# Patient Record
Sex: Female | Born: 1995 | Race: Black or African American | Hispanic: No | Smoking: Never smoker
Health system: Southern US, Community
[De-identification: ages and names within clinical notes are randomized; demographics above are authoritative.]

## PROBLEM LIST (undated history)

## (undated) DIAGNOSIS — F32A Depression, unspecified: Secondary | ICD-10-CM

## (undated) DIAGNOSIS — J45909 Unspecified asthma, uncomplicated: Secondary | ICD-10-CM

## (undated) DIAGNOSIS — G47419 Narcolepsy without cataplexy: Secondary | ICD-10-CM

## (undated) HISTORY — DX: Narcolepsy without cataplexy: G47.419

## (undated) HISTORY — DX: Depression, unspecified: F32.A

## (undated) HISTORY — PX: WISDOM TOOTH EXTRACTION: SHX21

---
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
## 2017-03-21 ENCOUNTER — Ambulatory Visit (INDEPENDENT_AMBULATORY_CARE_PROVIDER_SITE_OTHER): Payer: Managed Care, Other (non HMO) | Admitting: Obstetrics and Gynecology

## 2017-03-21 ENCOUNTER — Encounter: Payer: Self-pay | Admitting: Radiology

## 2017-03-21 VITALS — BP 102/68 | HR 83 | Wt 113.2 lb

## 2017-03-21 DIAGNOSIS — Z113 Encounter for screening for infections with a predominantly sexual mode of transmission: Secondary | ICD-10-CM | POA: Diagnosis not present

## 2017-03-21 DIAGNOSIS — N898 Other specified noninflammatory disorders of vagina: Secondary | ICD-10-CM | POA: Diagnosis not present

## 2017-03-21 NOTE — Progress Notes (Signed)
Vaginal discharge x 3 months.

## 2017-03-21 NOTE — Progress Notes (Signed)
21 yo G0 presenting today for the evaluation of breast asymmetry and a vaginal odor which has been present for the past 3 months. Patient reports noticing the difference in breast size yesterday. She denies any recent breast trauma. Patient also reports a non pruritic discharge with an odor intermittently. She is sexually active using condoms. She denies any pelvic pain. She reports a monthly 5-day period  No past medical history on file. No past surgical history on file. No family history on file. Social History  Substance Use Topics  . Smoking status: Not on file  . Smokeless tobacco: Not on file  . Alcohol use Not on file   ROS See pertinent in HPI  Blood pressure 102/68, pulse 83, weight 113 lb 3.2 oz (51.3 kg), last menstrual period 03/18/2017. GENERAL: Well-developed, well-nourished female in no acute distress.  BREASTS: Left breast slightly bigger than the other. No palpable masses or lymphadenopathy, skin changes, or nipple drainage. ABDOMEN: Soft, nontender, nondistended.  PELVIC: Normal external female genitalia. Vagina is pink and rugated.  Normal discharge. Normal appearing cervix. Uterus is normal in size. No adnexal mass or tenderness. EXTREMITIES: No cyanosis, clubbing, or edema, 2+ distal pulses.  A/P 21 yo with vaginal odor and breast asymmetry - Wet prep with cultures collected - Advised patient to monitor breast size. If a significant difference develops over the course of the next few years as her body continues to grow, we will refer to breast surgery for cosmetic corrections. Reassurance provided - Patient will be contacted with any abnormal results - Patient is not interested in contraception at this time

## 2017-03-22 LAB — CERVICOVAGINAL ANCILLARY ONLY
Bacterial vaginitis: POSITIVE — AB
Candida vaginitis: NEGATIVE
Chlamydia: NEGATIVE
Neisseria Gonorrhea: NEGATIVE
Trichomonas: NEGATIVE

## 2017-03-23 ENCOUNTER — Other Ambulatory Visit: Payer: Self-pay | Admitting: Obstetrics and Gynecology

## 2017-03-23 MED ORDER — METRONIDAZOLE 500 MG PO TABS: 500.0000 mg | ORAL_TABLET | Freq: Two times a day (BID) | ORAL | 0 refills | Status: DC

## 2017-03-26 ENCOUNTER — Telehealth: Payer: Self-pay | Admitting: *Deleted

## 2017-03-26 NOTE — Telephone Encounter (Signed)
Called pt and LM for her to rtn call - meds at pharmacy

## 2017-03-26 NOTE — Telephone Encounter (Signed)
-----   Message from Catalina Antigua, MD sent at 03/23/2017  9:24 AM EDT ----- Please inform patient of BV infection. Flagyl has been e-prescribed  Animator

## 2017-11-08 ENCOUNTER — Other Ambulatory Visit (INDEPENDENT_AMBULATORY_CARE_PROVIDER_SITE_OTHER): Payer: Managed Care, Other (non HMO)

## 2017-11-08 VITALS — BP 104/64 | HR 82

## 2017-11-08 DIAGNOSIS — N898 Other specified noninflammatory disorders of vagina: Secondary | ICD-10-CM

## 2017-11-08 DIAGNOSIS — Z113 Encounter for screening for infections with a predominantly sexual mode of transmission: Secondary | ICD-10-CM

## 2017-11-08 NOTE — Progress Notes (Unsigned)
SUBJECTIVE:  22 y.o. female complains of vaginal discharge for a couple of days. Denies abnormal vaginal bleeding or significant pelvic pain or fever. No UTI symptoms. Denies history of known exposure to STD.  No LMP recorded.  OBJECTIVE:  She appears well, afebrile. Urine dipstick: not done at this time  ASSESSMENT:  Vaginal Discharge  Vaginal Odor   PLAN:  GC, chlamydia, trichomonas, BVAG, CVAG probe sent to lab. Treatment: Patient requested a diflucan be called into her pharmacy until results come back.ROV prn if symptoms persist or worsen.

## 2017-11-09 ENCOUNTER — Other Ambulatory Visit: Payer: Self-pay | Admitting: Obstetrics & Gynecology

## 2017-11-09 DIAGNOSIS — B3731 Acute candidiasis of vulva and vagina: Secondary | ICD-10-CM

## 2017-11-09 DIAGNOSIS — A749 Chlamydial infection, unspecified: Secondary | ICD-10-CM

## 2017-11-09 DIAGNOSIS — B373 Candidiasis of vulva and vagina: Secondary | ICD-10-CM

## 2017-11-09 LAB — CERVICOVAGINAL ANCILLARY ONLY
Bacterial vaginitis: NEGATIVE
Candida vaginitis: POSITIVE — AB
Chlamydia: POSITIVE — AB
Neisseria Gonorrhea: NEGATIVE
Trichomonas: NEGATIVE

## 2017-11-09 MED ORDER — FLUCONAZOLE 150 MG PO TABS: 150.0000 mg | ORAL_TABLET | Freq: Once | ORAL | 3 refills | Status: AC

## 2017-11-09 MED ORDER — AZITHROMYCIN 500 MG PO TABS: 1000.0000 mg | ORAL_TABLET | Freq: Once | ORAL | 1 refills | Status: AC

## 2017-11-09 NOTE — Progress Notes (Signed)
Patient has a yeast infection, Diflucan prescribed.  Patient also has chlamydia.  Recommend testing for other STIs, also needs to let partner(s) know so the partner(s) can get testing and treatment. Patient and sex partner(s) should abstain from unprotected sexual activity for seven days after everyone receives appropriate treatment.  Azithromycin 1000 mg po x 1 was prescribed for patient.  Patient will need to return in about 4 weeks after treatment for repeat test of cure.  Please call to inform patient of results and recommendations, and advise to pick up prescription.  Jaynie Collins, MD

## 2017-12-07 ENCOUNTER — Other Ambulatory Visit (INDEPENDENT_AMBULATORY_CARE_PROVIDER_SITE_OTHER): Payer: Managed Care, Other (non HMO)

## 2017-12-07 VITALS — BP 112/70 | HR 72

## 2017-12-07 DIAGNOSIS — Z113 Encounter for screening for infections with a predominantly sexual mode of transmission: Secondary | ICD-10-CM | POA: Diagnosis not present

## 2017-12-07 DIAGNOSIS — Z202 Contact with and (suspected) exposure to infections with a predominantly sexual mode of transmission: Secondary | ICD-10-CM

## 2017-12-07 NOTE — Progress Notes (Signed)
Patient presented to the office today for a test of cure for chlamydia infection. Patient tested positive on 11/08/2017. Patient has been advised she will be contacted next week with results. Patient voice understanding.

## 2017-12-10 LAB — CERVICOVAGINAL ANCILLARY ONLY
Chlamydia: NEGATIVE
Neisseria Gonorrhea: NEGATIVE

## 2018-02-20 ENCOUNTER — Encounter: Payer: Self-pay | Admitting: Radiology

## 2018-02-20 ENCOUNTER — Ambulatory Visit (INDEPENDENT_AMBULATORY_CARE_PROVIDER_SITE_OTHER): Payer: Managed Care, Other (non HMO) | Admitting: Student

## 2018-02-20 VITALS — BP 101/69 | HR 72 | Wt 120.0 lb

## 2018-02-20 DIAGNOSIS — Z01419 Encounter for gynecological examination (general) (routine) without abnormal findings: Secondary | ICD-10-CM | POA: Diagnosis not present

## 2018-02-20 DIAGNOSIS — Z3201 Encounter for pregnancy test, result positive: Secondary | ICD-10-CM | POA: Diagnosis not present

## 2018-02-20 DIAGNOSIS — Z113 Encounter for screening for infections with a predominantly sexual mode of transmission: Secondary | ICD-10-CM | POA: Diagnosis not present

## 2018-02-20 LAB — POCT URINE PREGNANCY: Preg Test, Ur: NEGATIVE

## 2018-02-20 NOTE — Patient Instructions (Signed)
Preventing Sexually Transmitted Infections, Adult Sexually transmitted infections (STIs) are diseases that are passed (transmitted) from person to person through bodily fluids exchanged during sex or sexual contact. Bodily fluids include saliva, semen, blood, vaginal mucus, and urine. You may have an increased risk for developing an STI if you have unprotected oral, vaginal, or anal sex. Some common STIs include:  Herpes.  Hepatitis B.  Chlamydia.  Gonorrhea.  Syphilis.  HPV (human papillomavirus).  HIV (humanimmunodeficiency virus), the virus that can cause AIDS (acquired immunodeficiency virus).  How can I protect myself from sexually transmitted infections? The only way to completely prevent STIs is not to have sex of any kind (practice abstinence). This includes oral, vaginal, or anal sex. If you are sexually active, take these actions to lower your risk of getting an STI:  Have only one sex partner (be monogamous) or limit the number of sexual partners you have.  Stay up-to-date on immunizations. Certain vaccines can lower your risk of getting certain STIs, such as: ? Hepatitis A and B vaccines. You may have been vaccinated as a young child, but likely need a booster shot as a teen or young adult. ? HPV vaccine. This vaccine is recommended if you are a man under age 22 or a woman under age 27.  Use methods that prevent the exchange of body fluids between partners (barrier protection) every time you have sex. Barrier protection can be used during oral, vaginal, or anal sex. Commonly used barrier methods include: ? Female condom. ? Female condom. ? Dental dam.  Get tested regularly for STIs. Have your sexual partner get tested regularly as well.  Avoid mixing alcohol, drugs, and sex. Alcohol and drug use can affect your ability to make good decisions and can lead to risky sexual behaviors.  Ask your health care provider about taking pre-exposure prophylaxis (PrEP) to prevent HIV  infection if you: ? Have a HIV-positive sexual partner. ? Have multiple sexual partners or partners who do not know their HIV status, and do not regularly use a condom during sex. ? Use injection drugs and share needles.  Birth control pills, injections, implants, and intrauterine devices (IUDs) do not protect against STIs. To prevent both STIs and pregnancy, always use a condom with another form of birth control. Some STIs, such as herpes, are spread through skin to skin contact. A condom does not protect you from getting such STIs. If you or your partner have herpes and there is an active flare with open sores, avoid all sexual contact. Why are these changes important? Taking steps to practice safe sex protects you and others. Many STIs can be cured. However, some STIs are not curable and will affect you for the rest of your life. STIs can be passed on to another person even if you do not have symptoms. What can happen if changes are not made? Certain STIs may:  Require you to take medicine for the rest of your life.  Affect your ability to have children (your fertility).  Increase your risk for developing another STI or certain serious health conditions, such as: ? Cervical cancer. ? Head and neck cancer. ? Pelvic inflammatory disease (PID) in women. ? Organ damage or damage to other parts of your body, if the infection spreads.  Be passed to a baby during childbirth.  How are sexually transmitted infections treated? If you or your partner know or think that you may have an STI:  Talk with your healthcare provider about what can be   done to treat it. Some STIs can be treated and cured with medicines.  For curable STIs, you and your partner should avoid sex during treatment and for several days after treatment is complete.  You and your partner should both be treated at the same time, if there is any chance that your partner is infected as well. If you get treatment but your partner  does not, your partner can re-infect you when you resume sexual contact.  Do not have unprotected sex.  Where to find more information: Learn more about sexually transmitted diseases and infections from:  Centers for Disease Control and Prevention: ? More information about specific STIs: www.cdc.gov/std ? Find places to get sexual health counseling and treatment for free or for a low cost: gettested.cdc.gov  [place] Department of Health and Human Services: www.womenshealth.gov/publications/our-publications/fact-sheet/sexually-transmitted-infections.html  Summary  The only way to completely prevent STIs is not to have sex (practice abstinence), including oral, vaginal, or anal sex.  STIs can spread through saliva, semen, blood, vaginal mucus, urine, or sexual contact.  If you do have sex, limit your number of sexual partners and use a barrier protection method every time you have sex.  If you develop an STI, get treated right away and ask your partner to be treated as well. Do not resume having sex until both of you have completed treatment for the STI. This information is not intended to replace advice given to you by your health care provider. Make sure you discuss any questions you have with your health care provider. Document Released: 06/15/2016 Document Revised: 06/15/2016 Document Reviewed: 06/15/2016 Elsevier Interactive Patient Education  2018 Elsevier Inc.  

## 2018-02-20 NOTE — Progress Notes (Signed)
History:  [name] is a 22 year old non-pregnant female. who presents to clinic today for annual exam. She denies dysuria, vaginal itching, vaginal or pelvic pain. She is not interested in any hormonal methods of birth control; states that she had Nexplanon before and didn't like it. Her mother does not want her to get on any birth control; her mother is concerned abotu blood clots. Patient planning to use condoms. She has not had a pap yet. She wants STI testing.   The following portions of the patient's history were reviewed and updated as appropriate: allergies, current medications, family history, past medical history, social history, past surgical history and problem list.   Review of Systems:  Review of Systems  Constitutional: Negative.   HENT: Negative.   Respiratory: Negative.   Cardiovascular: Negative.   Gastrointestinal: Negative.   Genitourinary: Negative.   Musculoskeletal: Negative.   Skin: Negative.   Neurological: Negative.   Psychiatric/Behavioral: Negative.       Objective:  Physical Exam BP 101/69   Pulse 72   Wt 120 lb (54.4 kg)  Physical Exam  Constitutional: She appears well-developed.  HENT:  Head: Normocephalic.  Neck: Normal range of motion. Neck supple.  Pulmonary/Chest: Effort normal.  Abdominal: Soft.  Genitourinary: Vagina normal. No vaginal discharge found.  Genitourinary Comments: Normal external female genitalia; no discharge in the vagina. No odor; no CMT, suprapubic tenderness or adnexal tenderness.   Musculoskeletal: Normal range of motion.  Neurological: She is alert.  Psychiatric: She has a normal mood and affect.     Labs and Imaging No results found for this or any previous visit (from the past 24 hour(s)).  No results found.   Assessment & Plan:  1. Women's annual routine gynecological examination -Return to clinic if she decides she wants birth control; long discussion with patient about the fact that she is a  healthy woman who could benefit from an IUD or other methods with very little risk to her. Patient declines all contraception.  - Cervicovaginal ancillary only - HIV antibody - RPR   Marylene LandKooistra, Caelin Rosen Lorraine, CNM 02/20/2018 3:57 PM

## 2018-02-20 NOTE — Addendum Note (Signed)
Addended by: Areta HaberMOREHEAD, Daryl Beehler B on: 02/20/2018 05:13 PM   Modules accepted: Orders

## 2018-02-21 ENCOUNTER — Encounter: Payer: Self-pay | Admitting: Student

## 2018-02-22 LAB — CERVICOVAGINAL ANCILLARY ONLY
Bacterial vaginitis: NEGATIVE
Candida vaginitis: NEGATIVE
Chlamydia: NEGATIVE
Neisseria Gonorrhea: NEGATIVE
Trichomonas: NEGATIVE

## 2018-02-26 ENCOUNTER — Other Ambulatory Visit: Payer: Managed Care, Other (non HMO)

## 2018-02-26 DIAGNOSIS — Z01419 Encounter for gynecological examination (general) (routine) without abnormal findings: Secondary | ICD-10-CM

## 2018-02-27 LAB — RPR: RPR Ser Ql: NONREACTIVE

## 2018-02-27 LAB — HIV ANTIBODY (ROUTINE TESTING W REFLEX): HIV Screen 4th Generation wRfx: NONREACTIVE

## 2018-07-01 ENCOUNTER — Ambulatory Visit (INDEPENDENT_AMBULATORY_CARE_PROVIDER_SITE_OTHER): Payer: Managed Care, Other (non HMO) | Admitting: *Deleted

## 2018-07-01 VITALS — BP 100/67 | HR 70

## 2018-07-01 DIAGNOSIS — N898 Other specified noninflammatory disorders of vagina: Secondary | ICD-10-CM | POA: Diagnosis not present

## 2018-07-01 DIAGNOSIS — Z113 Encounter for screening for infections with a predominantly sexual mode of transmission: Secondary | ICD-10-CM

## 2018-07-01 NOTE — Progress Notes (Signed)
SUBJECTIVE:  22 y.o. female complains of yellow vaginal discharge for 1 month. Denies abnormal vaginal bleeding or significant pelvic pain or fever. No UTI symptoms. Denies history of known exposure to STD.  No LMP recorded.  OBJECTIVE:  She appears well, afebrile. Urine Dipstick: not done in office today.  Assessment:  Change in vaginal discharge.  No vaginal odor.    PLAN:  Self swab performed and sent to lab. Will test for GC, chlamydia, Trich, B-Vag and C-Vag.  Treatment: To be determined once lab results are received.   ROV prn if symptoms persist or worsen.  Scheryl Martenhristine Placido Hangartner, RN

## 2018-07-02 NOTE — Progress Notes (Signed)
I have reviewed the chart and agree with nursing staff's documentation of this patient's encounter.  Eudell Julian, MD 07/02/2018 11:12 AM    

## 2018-07-05 LAB — CERVICOVAGINAL ANCILLARY ONLY
Bacterial vaginitis: NEGATIVE
Candida vaginitis: NEGATIVE
Chlamydia: NEGATIVE
Neisseria Gonorrhea: NEGATIVE
Trichomonas: NEGATIVE

## 2018-12-27 ENCOUNTER — Other Ambulatory Visit: Payer: Self-pay

## 2018-12-27 ENCOUNTER — Ambulatory Visit (INDEPENDENT_AMBULATORY_CARE_PROVIDER_SITE_OTHER): Payer: Managed Care, Other (non HMO) | Admitting: *Deleted

## 2018-12-27 VITALS — BP 90/54 | HR 81

## 2018-12-27 DIAGNOSIS — N898 Other specified noninflammatory disorders of vagina: Secondary | ICD-10-CM | POA: Diagnosis not present

## 2018-12-27 DIAGNOSIS — B373 Candidiasis of vulva and vagina: Secondary | ICD-10-CM | POA: Diagnosis not present

## 2018-12-27 DIAGNOSIS — Z113 Encounter for screening for infections with a predominantly sexual mode of transmission: Secondary | ICD-10-CM | POA: Diagnosis not present

## 2018-12-27 NOTE — Progress Notes (Signed)
SUBJECTIVE:  23 y.o. female complains of white vaginal discharge for 2 week(s). Denies abnormal vaginal bleeding or significant pelvic pain or fever. No UTI symptoms. Denies history of known exposure to STD.  No LMP recorded.  OBJECTIVE:  She appears well, afebrile.   ASSESSMENT:  Vaginal Discharge     PLAN:  GC, chlamydia, trichomonas, BVAG, CVAG probe sent to lab. Treatment: To be determined once lab results are received ROV prn if symptoms persist or worsen.

## 2018-12-27 NOTE — Progress Notes (Signed)
I have reviewed the chart and agree with nursing staff's documentation of this patient's encounter.  Verita Schneiders, MD 12/27/2018 10:19 AM

## 2018-12-31 LAB — CERVICOVAGINAL ANCILLARY ONLY
Bacterial vaginitis: NEGATIVE
Candida vaginitis: POSITIVE — AB
Chlamydia: NEGATIVE
Neisseria Gonorrhea: NEGATIVE
Trichomonas: NEGATIVE

## 2019-01-01 ENCOUNTER — Other Ambulatory Visit: Payer: Self-pay | Admitting: Obstetrics & Gynecology

## 2019-01-01 DIAGNOSIS — B373 Candidiasis of vulva and vagina: Secondary | ICD-10-CM

## 2019-01-01 DIAGNOSIS — B3731 Acute candidiasis of vulva and vagina: Secondary | ICD-10-CM

## 2019-01-01 MED ORDER — FLUCONAZOLE 150 MG PO TABS: 150.0000 mg | ORAL_TABLET | Freq: Once | ORAL | 3 refills | Status: AC

## 2019-01-01 NOTE — Progress Notes (Signed)
Vaginal discharge test is abnormal and showed yeast infection. Diflucan was prescribed.  Patient was informed via MyChart and advised to pick up prescription.

## 2019-01-16 ENCOUNTER — Encounter: Payer: Self-pay | Admitting: Radiology

## 2019-03-17 ENCOUNTER — Ambulatory Visit (INDEPENDENT_AMBULATORY_CARE_PROVIDER_SITE_OTHER): Payer: Managed Care, Other (non HMO) | Admitting: *Deleted

## 2019-03-17 ENCOUNTER — Other Ambulatory Visit: Payer: Self-pay

## 2019-03-17 VITALS — BP 109/70 | HR 84

## 2019-03-17 DIAGNOSIS — Z113 Encounter for screening for infections with a predominantly sexual mode of transmission: Secondary | ICD-10-CM

## 2019-03-17 DIAGNOSIS — N898 Other specified noninflammatory disorders of vagina: Secondary | ICD-10-CM

## 2019-03-17 NOTE — Progress Notes (Addendum)
SUBJECTIVE:  23 y.o. female complains of a change in her discharge and desires a full STD screen.   Denies abnormal vaginal bleeding or significant pelvic pain or fever. No UTI symptoms. Denies history of known exposure to STD.  No LMP recorded.  OBJECTIVE:  She appears well, afebrile. Urine dipstick: not done.  ASSESSMENT:  Vaginal Discharge    PLAN:  GC, chlamydia, trichomonas, BVAG, CVAG probe sent to lab. STD labs collected. Treatment: To be determined once lab results are received ROV prn if symptoms persist or worsen.   Attestation of Attending Supervision of RN: Evaluation and management procedures were performed by the nurse under my supervision and collaboration.  I have reviewed the nursing note and chart, and I agree with the management and plan.  Carolyn L. Harraway-Smith, M.D., Cherlynn June

## 2019-03-18 LAB — HEPATITIS B SURFACE ANTIGEN: Hepatitis B Surface Ag: NEGATIVE

## 2019-03-18 LAB — HIV ANTIBODY (ROUTINE TESTING W REFLEX): HIV Screen 4th Generation wRfx: NONREACTIVE

## 2019-03-18 LAB — HEPATITIS C ANTIBODY: Hep C Virus Ab: <0.1 s/co ratio (ref 0.0–0.9)

## 2019-03-18 LAB — RPR: RPR Ser Ql: NONREACTIVE

## 2019-03-19 LAB — CERVICOVAGINAL ANCILLARY ONLY
Bacterial vaginitis: NEGATIVE
Candida vaginitis: NEGATIVE
Chlamydia: NEGATIVE
Neisseria Gonorrhea: NEGATIVE
Trichomonas: NEGATIVE

## 2019-06-04 ENCOUNTER — Other Ambulatory Visit (INDEPENDENT_AMBULATORY_CARE_PROVIDER_SITE_OTHER): Payer: Managed Care, Other (non HMO)

## 2019-06-04 ENCOUNTER — Other Ambulatory Visit: Payer: Self-pay

## 2019-06-04 VITALS — BP 100/64 | HR 69

## 2019-06-04 DIAGNOSIS — N898 Other specified noninflammatory disorders of vagina: Secondary | ICD-10-CM | POA: Diagnosis not present

## 2019-06-04 DIAGNOSIS — Z113 Encounter for screening for infections with a predominantly sexual mode of transmission: Secondary | ICD-10-CM | POA: Diagnosis not present

## 2019-06-04 DIAGNOSIS — B373 Candidiasis of vulva and vagina: Secondary | ICD-10-CM | POA: Diagnosis not present

## 2019-06-04 DIAGNOSIS — A749 Chlamydial infection, unspecified: Secondary | ICD-10-CM

## 2019-06-04 NOTE — Progress Notes (Signed)
SUBJECTIVE:  23 y.o. female complains of vaginal odor x 3 days. Denies any vaginal discharge,  abnormal vaginal bleeding or significant pelvic pain or fever. No UTI symptoms. Denies history of known exposure to STD.  No LMP recorded.  OBJECTIVE:  She appears well, afebrile. Urine dipstick: not done.  ASSESSMENT:  Vaginal Odor   PLAN:  GC, chlamydia, trichomonas, BVAG, CVAG probe sent to lab. Treatment: To be determined once lab results are received ROV prn if symptoms persist or worsen.

## 2019-06-05 LAB — CERVICOVAGINAL ANCILLARY ONLY
Bacterial Vaginitis (gardnerella): NEGATIVE
Candida Glabrata: NEGATIVE
Candida Vaginitis: POSITIVE — AB
Chlamydia: POSITIVE — AB
Comment: NEGATIVE
Comment: NEGATIVE
Comment: NEGATIVE
Comment: NEGATIVE
Comment: NEGATIVE
Comment: NORMAL
Neisseria Gonorrhea: NEGATIVE
Trichomonas: NEGATIVE

## 2019-06-06 DIAGNOSIS — A749 Chlamydial infection, unspecified: Secondary | ICD-10-CM | POA: Insufficient documentation

## 2019-06-06 HISTORY — DX: Chlamydial infection, unspecified: A74.9

## 2019-06-06 MED ORDER — AZITHROMYCIN 500 MG PO TABS: 1000.0000 mg | ORAL_TABLET | Freq: Once | ORAL | 1 refills | Status: AC

## 2019-06-06 NOTE — Progress Notes (Addendum)
Attestation of Attending Supervision of clinical support staff: I agree with the care provided to this patient and was available for any consultation.  I have reviewed the CMA's note and chart, and I agree with the management and plan.  Caren Macadam, MD, MPH, ABFM Attending [place] for [facility]  Reviewed results. Chlamydia positive azithromycin sent to pharmacy.

## 2019-06-06 NOTE — Addendum Note (Signed)
Addended by: Caryl Bis on: 06/06/2019 05:16 PM   Modules accepted: Orders

## 2019-06-09 ENCOUNTER — Telehealth: Payer: Self-pay | Admitting: *Deleted

## 2019-06-09 NOTE — Telephone Encounter (Signed)
Called pt to make sure she saw her mychart message from Dr Ernestina Patches. Pt verbalized that she did get her message about her results.

## 2019-06-09 NOTE — Telephone Encounter (Signed)
-----   Message from Caren Macadam, MD sent at 06/06/2019  5:15 PM EST ----- Positive chlamydia-- sent in rx for azithromycin to pharmacy on file. Sent my-chart to patient about positive results.  Clinic to call with results as well.

## 2019-07-17 ENCOUNTER — Other Ambulatory Visit: Payer: Self-pay

## 2019-07-17 ENCOUNTER — Ambulatory Visit (INDEPENDENT_AMBULATORY_CARE_PROVIDER_SITE_OTHER): Payer: Managed Care, Other (non HMO)

## 2019-07-17 VITALS — BP 98/72 | HR 72

## 2019-07-17 DIAGNOSIS — Z113 Encounter for screening for infections with a predominantly sexual mode of transmission: Secondary | ICD-10-CM | POA: Diagnosis not present

## 2019-07-17 DIAGNOSIS — N898 Other specified noninflammatory disorders of vagina: Secondary | ICD-10-CM

## 2019-07-17 DIAGNOSIS — Z202 Contact with and (suspected) exposure to infections with a predominantly sexual mode of transmission: Secondary | ICD-10-CM

## 2019-07-17 DIAGNOSIS — B373 Candidiasis of vulva and vagina: Secondary | ICD-10-CM

## 2019-07-17 DIAGNOSIS — B3731 Acute candidiasis of vulva and vagina: Secondary | ICD-10-CM

## 2019-07-17 NOTE — Progress Notes (Signed)
SUBJECTIVE:  24 y.o. female complains of vaginal discharge for a couple of days. Denies abnormal vaginal bleeding or significant pelvic pain or fever. No UTI symptoms. Denies history of known exposure to STD.  No LMP recorded.  OBJECTIVE:  She appears well, afebrile. Urine dipstick: not done   ASSESSMENT:  Vaginal Discharge small amount  Vaginal Odor: small amount    PLAN:  GC, chlamydia, trichomonas, BVAG, CVAG probe sent to lab. Treatment: To be determined once lab results are received ROV prn if symptoms persist or worsen.

## 2019-07-21 LAB — CERVICOVAGINAL ANCILLARY ONLY
Bacterial Vaginitis (gardnerella): NEGATIVE
Candida Glabrata: NEGATIVE
Candida Vaginitis: POSITIVE — AB
Chlamydia: NEGATIVE
Comment: NEGATIVE
Comment: NEGATIVE
Comment: NEGATIVE
Comment: NEGATIVE
Comment: NEGATIVE
Comment: NORMAL
Neisseria Gonorrhea: NEGATIVE
Trichomonas: NEGATIVE

## 2019-07-22 MED ORDER — FLUCONAZOLE 150 MG PO TABS: 150.0000 mg | ORAL_TABLET | Freq: Once | ORAL | 3 refills | Status: AC

## 2019-07-22 NOTE — Addendum Note (Signed)
Addended by: Jaynie Collins A on: 07/22/2019 09:21 AM   Modules accepted: Orders

## 2019-10-08 ENCOUNTER — Other Ambulatory Visit (HOSPITAL_COMMUNITY)
Admission: RE | Admit: 2019-10-08 | Discharge: 2019-10-08 | Disposition: A | Discharge status: Home / Self Care | Payer: Managed Care, Other (non HMO) | Source: Ambulatory Visit | Attending: Family Medicine | Admitting: Family Medicine

## 2019-10-08 ENCOUNTER — Encounter: Payer: Self-pay | Admitting: Family Medicine

## 2019-10-08 ENCOUNTER — Other Ambulatory Visit: Payer: Self-pay

## 2019-10-08 ENCOUNTER — Ambulatory Visit (INDEPENDENT_AMBULATORY_CARE_PROVIDER_SITE_OTHER): Payer: Managed Care, Other (non HMO) | Admitting: Family Medicine

## 2019-10-08 VITALS — BP 119/78 | HR 72 | Wt 122.1 lb

## 2019-10-08 DIAGNOSIS — Z124 Encounter for screening for malignant neoplasm of cervix: Secondary | ICD-10-CM | POA: Diagnosis present

## 2019-10-08 DIAGNOSIS — R87610 Atypical squamous cells of undetermined significance on cytologic smear of cervix (ASC-US): Secondary | ICD-10-CM | POA: Diagnosis not present

## 2019-10-08 DIAGNOSIS — Z1272 Encounter for screening for malignant neoplasm of vagina: Secondary | ICD-10-CM | POA: Diagnosis not present

## 2019-10-08 DIAGNOSIS — R87810 Cervical high risk human papillomavirus (HPV) DNA test positive: Secondary | ICD-10-CM | POA: Insufficient documentation

## 2019-10-08 DIAGNOSIS — Z113 Encounter for screening for infections with a predominantly sexual mode of transmission: Secondary | ICD-10-CM | POA: Insufficient documentation

## 2019-10-08 DIAGNOSIS — G47419 Narcolepsy without cataplexy: Secondary | ICD-10-CM | POA: Insufficient documentation

## 2019-10-08 DIAGNOSIS — Z3009 Encounter for other general counseling and advice on contraception: Secondary | ICD-10-CM

## 2019-10-08 DIAGNOSIS — R5383 Other fatigue: Secondary | ICD-10-CM | POA: Diagnosis not present

## 2019-10-08 NOTE — Progress Notes (Signed)
Blood work today.

## 2019-10-08 NOTE — Progress Notes (Signed)
   GYNECOLOGY PROBLEM  VISIT ENCOUNTER NOTE  Subjective:   Heather Schmidt is a 24 y.o. G0. female here for a a problem GYN visit.  Current complaints: none.  Desires STI screening- last in Jan was negative. In Dec was positive for CT. Does not desire pregnancy in the next year. Has used nexplanon prior but had SE fo bleeding and mood disturbance. Uses condoms consistently but does sometimes have unprotected sex..   Denies abnormal vaginal bleeding, discharge, pelvic pain, problems with intercourse or other gynecologic concerns.    Gynecologic History Patient's last menstrual period was 09/06/2019. Contraception: condoms  Health Maintenance Due  Topic Date Due  . TETANUS/TDAP  Never done  . PAP-Cervical Cytology Screening  Never done  . PAP SMEAR-Modifier  Never done     The following portions of the patient's history were reviewed and updated as appropriate: allergies, current medications, past family history, past medical history, past social history, past surgical history and problem list.  Review of Systems Pertinent items are noted in HPI.   Objective:  BP 119/78   Pulse 72   Wt 122 lb 1.6 oz (55.4 kg)   LMP 09/06/2019  Gen: well appearing, NAD HEENT: no scleral icterus CV: RR Lung: Normal WOB Ext: warm well perfused  PELVIC: Normal appearing external genitalia; normal appearing vaginal mucosa and cervix.  No abnormal discharge noted.  Pap smear obtained.  Normal uterine size, no other palpable masses, no uterine or adnexal tenderness.   Assessment and Plan:  1. Screening examination for venereal disease Reviewed condom use and combining with fertility awareness - Cervicovaginal ancillary only( [place]) - Cytology - PAP( [place]) - HIV Antibody (routine testing w rflx) - RPR  2. Cervical cancer screening - Cytology - PAP( [place])  3. Fatigue Associated with craving salt -TSH - Vitamin D  4. Contraceptive counseling Contraception  counseling: Reviewed all forms of birth control options in the tiered based approach. available including abstinence; over the counter/barrier methods; hormonal contraceptive medication including pill, patch, ring, injection,contraceptive implant; hormonal and nonhormonal IUDs; permanent sterilization options including vasectomy and the various tubal sterilization modalities. Risks, benefits, and typical effectiveness rates were reviewed.  Questions were answered.  Written information was also given to the patient to review.  Patient desires to use condoms, this was prescribed for patient. She will follow up in  1 yr for surveillance.  She was told to call with any further questions, or with any concerns about this method of contraception.  Emphasized use of condoms 100% of the time for STI prevention.   Face to face time:  25 minutes  Greater than 50% of the visit time was spent in counseling and coordination of care with the patient.  The summary and outline of the counseling and care coordination is summarized in the note above.   All questions were answered.  Please refer to After Visit Summary for other counseling recommendations.   Return in about 1 year (around 10/07/2020) for Yearly wellness exam.  Federico Flake, MD, MPH, ABFM Attending Physician [facility]

## 2019-10-09 LAB — VITAMIN D 25 HYDROXY (VIT D DEFICIENCY, FRACTURES): Vit D, 25-Hydroxy: 23.6 ng/mL — ABNORMAL LOW (ref 30.0–100.0)

## 2019-10-09 LAB — HIV ANTIBODY (ROUTINE TESTING W REFLEX): HIV Screen 4th Generation wRfx: NONREACTIVE

## 2019-10-09 LAB — RPR: RPR Ser Ql: NONREACTIVE

## 2019-10-09 LAB — TSH: TSH: 0.816 u[IU]/mL (ref 0.450–4.500)

## 2019-10-10 LAB — CERVICOVAGINAL ANCILLARY ONLY
Bacterial Vaginitis (gardnerella): NEGATIVE
Candida Glabrata: NEGATIVE
Candida Vaginitis: NEGATIVE
Chlamydia: NEGATIVE
Comment: NEGATIVE
Comment: NEGATIVE
Comment: NEGATIVE
Comment: NEGATIVE
Comment: NEGATIVE
Comment: NORMAL
Neisseria Gonorrhea: NEGATIVE
Trichomonas: NEGATIVE

## 2019-10-13 LAB — CYTOLOGY - PAP
Chlamydia: NEGATIVE
Comment: NEGATIVE
Comment: NEGATIVE
Comment: NEGATIVE
Comment: NEGATIVE
Comment: NEGATIVE
Comment: NORMAL
Diagnosis: UNDETERMINED — AB
HPV 16: NEGATIVE
HPV 18 / 45: NEGATIVE
High risk HPV: POSITIVE — AB
Neisseria Gonorrhea: NEGATIVE
Trichomonas: NEGATIVE

## 2019-10-15 ENCOUNTER — Encounter (HOSPITAL_COMMUNITY): Payer: Self-pay

## 2019-10-15 ENCOUNTER — Emergency Department (HOSPITAL_COMMUNITY): Payer: Managed Care, Other (non HMO)

## 2019-10-15 ENCOUNTER — Emergency Department (HOSPITAL_COMMUNITY)
Admission: EM | Admit: 2019-10-15 | Discharge: 2019-10-15 | Disposition: A | Discharge status: Home / Self Care | Payer: Managed Care, Other (non HMO) | Attending: Emergency Medicine | Admitting: Emergency Medicine

## 2019-10-15 ENCOUNTER — Other Ambulatory Visit: Payer: Self-pay

## 2019-10-15 DIAGNOSIS — Z79899 Other long term (current) drug therapy: Secondary | ICD-10-CM | POA: Diagnosis not present

## 2019-10-15 DIAGNOSIS — R519 Headache, unspecified: Secondary | ICD-10-CM

## 2019-10-15 DIAGNOSIS — F1210 Cannabis abuse, uncomplicated: Secondary | ICD-10-CM | POA: Insufficient documentation

## 2019-10-15 DIAGNOSIS — H5713 Ocular pain, bilateral: Secondary | ICD-10-CM | POA: Insufficient documentation

## 2019-10-15 DIAGNOSIS — G43909 Migraine, unspecified, not intractable, without status migrainosus: Secondary | ICD-10-CM | POA: Diagnosis present

## 2019-10-15 HISTORY — DX: Unspecified asthma, uncomplicated: J45.909

## 2019-10-15 LAB — CBC WITH DIFFERENTIAL/PLATELET
Abs Immature Granulocytes: 0.01 10*3/uL (ref 0.00–0.07)
Basophils Absolute: 0 10*3/uL (ref 0.0–0.1)
Basophils Relative: 0 %
Eosinophils Absolute: 0 10*3/uL (ref 0.0–0.5)
Eosinophils Relative: 0 %
HCT: 41.2 % (ref 36.0–46.0)
Hemoglobin: 12.9 g/dL (ref 12.0–15.0)
Immature Granulocytes: 0 %
Lymphocytes Relative: 19 %
Lymphs Abs: 1.1 10*3/uL (ref 0.7–4.0)
MCH: 28.8 pg (ref 26.0–34.0)
MCHC: 31.3 g/dL (ref 30.0–36.0)
MCV: 92 fL (ref 80.0–100.0)
Monocytes Absolute: 0.5 10*3/uL (ref 0.1–1.0)
Monocytes Relative: 9 %
Neutro Abs: 4.2 10*3/uL (ref 1.7–7.7)
Neutrophils Relative %: 72 %
Platelets: 210 10*3/uL (ref 150–400)
RBC: 4.48 MIL/uL (ref 3.87–5.11)
RDW: 13.1 % (ref 11.5–15.5)
WBC: 5.9 10*3/uL (ref 4.0–10.5)
nRBC: 0 % (ref 0.0–0.2)

## 2019-10-15 LAB — BASIC METABOLIC PANEL
Anion gap: 13 (ref 5–15)
BUN: 7 mg/dL (ref 6–20)
CO2: 24 mmol/L (ref 22–32)
Calcium: 9.2 mg/dL (ref 8.9–10.3)
Chloride: 103 mmol/L (ref 98–111)
Creatinine, Ser: 0.67 mg/dL (ref 0.44–1.00)
GFR calc Af Amer: >60 mL/min (ref >60)
GFR calc non Af Amer: >60 mL/min (ref >60)
Glucose, Bld: 87 mg/dL (ref 70–99)
Potassium: 3.6 mmol/L (ref 3.5–5.1)
Sodium: 140 mmol/L (ref 135–145)

## 2019-10-15 LAB — I-STAT BETA HCG BLOOD, ED (MC, WL, AP ONLY): I-stat hCG, quantitative: <5 m[IU]/mL (ref <5)

## 2019-10-15 MED ORDER — ACETAMINOPHEN 500 MG PO TABS: 500.0000 mg | ORAL_TABLET | Freq: Four times a day (QID) | ORAL | 0 refills | Status: DC | PRN

## 2019-10-15 MED ORDER — KETOROLAC TROMETHAMINE 30 MG/ML IJ SOLN
30.0000 mg | Freq: Once | INTRAMUSCULAR | Status: AC
  Administered 2019-10-15: 30 mg via INTRAVENOUS
  Filled 2019-10-15: qty 1

## 2019-10-15 MED ORDER — IBUPROFEN 800 MG PO TABS: 800.0000 mg | ORAL_TABLET | Freq: Three times a day (TID) | ORAL | 0 refills | Status: DC

## 2019-10-15 MED ORDER — SODIUM CHLORIDE 0.9 % IV BOLUS
1000.0000 mL | Freq: Once | INTRAVENOUS | Status: AC
  Administered 2019-10-15: 1000 mL via INTRAVENOUS

## 2019-10-15 MED ORDER — METOCLOPRAMIDE HCL 5 MG/ML IJ SOLN
10.0000 mg | Freq: Once | INTRAMUSCULAR | Status: AC
  Administered 2019-10-15: 10 mg via INTRAVENOUS
  Filled 2019-10-15: qty 2

## 2019-10-15 NOTE — ED Triage Notes (Signed)
Patient c/o headache x 4 days. Patient states when she moves her eye her headache worsens. Patient denies any N/V, blurred vision or sensitivity to light.

## 2019-10-15 NOTE — ED Provider Notes (Signed)
[facility]-EMERGENCY DEPT Provider Note   CSN: [id number] Arrival date & time: 10/15/19  1203     History Chief Complaint  Patient presents with  . Migraine    Heather Schmidt is a 24 y.o. female with no PMH who presents to the ED with a 4-day history of headache described as constant, 9 out of 10 pain behind her eyes bilaterally.  She initially suspected sinus headache given her history of seasonal allergies, but she has been taking antihistamines and Flonase without relief.  She denies history of headaches or migraines and she has attempted to treat this conservatively at home with ibuprofen, with out relief.  She admits feeling hot and feverish last night, but did not take her temperature.  Patient states that every time she walks around and moves her eyes she experiences worsening discomfort.  She says she closes her eyes for extended period of time due to this pain.  She denies any dizziness, blurred vision, runny nose or congestion, foreign body sensation in eye, itchy watery eyes, painful eye, red eye, sore throat, neck pain, meningismus, chest pain or difficulty breathing, difficulty swallowing, abdominal pain, nausea or vomiting, or other symptoms.  HPI     Past Medical History:  Diagnosis Date  . Asthma   . Chlamydia 06/06/2019    Patient Active Problem List   Diagnosis Date Noted  . Fatigue 10/08/2019    History reviewed. No pertinent surgical history.   OB History    Gravida  0   Para  0   Term  0   Preterm  0   AB  0   Living  0     SAB  0   TAB  0   Ectopic  0   Multiple  0   Live Births  0           Family History  Problem Relation Age of Onset  . Asthma Mother   . Thyroid disease Mother   . Asthma Father     Social History   Tobacco Use  . Smoking status: Never Smoker  . Smokeless tobacco: Never Used  Substance Use Topics  . Alcohol use: Yes  . Drug use: Yes    Types: Marijuana    Home  Medications Prior to Admission medications   Medication Sig Start Date End Date Taking? Authorizing Provider  albuterol (PROVENTIL HFA;VENTOLIN HFA) 108 (90 BASE) MCG/ACT inhaler Inhale 2 puffs into the lungs every 6 (six) hours as needed for wheezing.   Yes [provider]  EPINEPHrine (EPIPEN JR) 0.15 MG/0.3ML injection Inject 0.15 mg into the muscle as needed for anaphylaxis.   Yes [provider]  acetaminophen (TYLENOL) 500 MG tablet Take 1 tablet (500 mg total) by mouth every 6 (six) hours as needed. 10/15/19   Lorelee New, PA-C  ibuprofen (ADVIL) 800 MG tablet Take 1 tablet (800 mg total) by mouth 3 (three) times daily. 10/15/19   Lorelee New, PA-C    Allergies    Fish allergy, Peanut-containing drug products, Shellfish allergy, and Other  Review of Systems   Review of Systems  HENT: Positive for sinus pressure.   Eyes: Negative for pain and redness.  Gastrointestinal: Negative for nausea and vomiting.  Neurological: Positive for headaches.    Physical Exam Updated Vital Signs BP 115/75   Pulse 94   Temp 99.5 F (37.5 C) (Oral)   Resp 18   Ht 5\' 2"  (1.575 m)   Wt  57.2 kg   LMP 10/04/2019   SpO2 100%   BMI 23.05 kg/m   Physical Exam Vitals and nursing note reviewed. Exam conducted with a chaperone present.  Constitutional:      Appearance: Normal appearance.  HENT:     Head: Normocephalic and atraumatic.     Mouth/Throat:     Pharynx: Oropharynx is clear.  Eyes:     General: No scleral icterus.    Extraocular Movements: Extraocular movements intact.     Conjunctiva/sclera: Conjunctivae normal.     Pupils: Pupils are equal, round, and reactive to light.     Comments: Pain with EOMs. No nystagmus. No conjunctival injection.  No swelling.  No proptosis.  Neck:     Comments: No nuchal rigidity. Cardiovascular:     Rate and Rhythm: Normal rate and regular rhythm.  Pulmonary:     Effort: Pulmonary effort is normal.  Musculoskeletal:          General: Normal range of motion.     Cervical back: Normal range of motion and neck supple. No rigidity.  Skin:    General: Skin is dry.     Capillary Refill: Capillary refill takes less than 2 seconds.  Neurological:     Mental Status: She is alert and oriented to person, place, and time.     GCS: GCS eye subscore is 4. GCS verbal subscore is 5. GCS motor subscore is 6.     Comments: CN II through XII grossly intact.  Negative Brudzinski test.  No meningismus.  PERRL and EOM intact.  Pain with EOM.  Negative Romberg.  Sensation and ROM intact throughout.  No visual impairment.  Psychiatric:        Mood and Affect: Mood normal.        Behavior: Behavior normal.        Thought Content: Thought content normal.     ED Results / Procedures / Treatments   Labs (all labs ordered are listed, but only abnormal results are displayed) Labs Reviewed  BASIC METABOLIC PANEL  CBC WITH DIFFERENTIAL/PLATELET  I-STAT BETA HCG BLOOD, ED ([facility], [facility], [facility] ONLY)    EKG None  Radiology CT Head Wo Contrast  Result Date: 10/15/2019 CLINICAL DATA:  headache and bilateral eye pain for 4 days. EXAM: CT HEAD WITHOUT CONTRAST CT MAXILLOFACIAL WITHOUT CONTRAST TECHNIQUE: Multidetector CT imaging of the head and maxillofacial structures were performed using the standard protocol without intravenous contrast. Multiplanar CT image reconstructions of the maxillofacial structures were also generated. COMPARISON:  None. FINDINGS: CT HEAD FINDINGS Brain: There is no mass, hemorrhage or extra-axial collection. The size and configuration of the ventricles and extra-axial CSF spaces are normal. The brain parenchyma is normal, without evidence of acute or chronic infarction. Vascular: No hyperdense vessel or unexpected vascular calcification. Skull: The visualized skull base, calvarium and extracranial soft tissues are normal. CT MAXILLOFACIAL FINDINGS Osseous: --Complex facial fracture types: No LeFort, zygomaticomaxillary  complex or nasoorbitoethmoidal fracture. --Simple fracture types: None. --Mandible, hard palate and teeth: No acute abnormality. Orbits: The globes and optic nerves are intact. Normal extraocular muscles and intraorbital fat. Sinuses: No acute finding. Soft tissues: Normal visualized extracranial soft tissues. IMPRESSION: 1. Normal head CT. 2. Normal orbits. Electronically Signed   By: Deatra Robinson M.D.   On: 10/15/2019 20:35   CT Maxillofacial Wo Contrast  Result Date: 10/15/2019 CLINICAL DATA:  headache and bilateral eye pain for 4 days. EXAM: CT HEAD WITHOUT CONTRAST CT MAXILLOFACIAL WITHOUT CONTRAST TECHNIQUE: Multidetector CT imaging  of the head and maxillofacial structures were performed using the standard protocol without intravenous contrast. Multiplanar CT image reconstructions of the maxillofacial structures were also generated. COMPARISON:  None. FINDINGS: CT HEAD FINDINGS Brain: There is no mass, hemorrhage or extra-axial collection. The size and configuration of the ventricles and extra-axial CSF spaces are normal. The brain parenchyma is normal, without evidence of acute or chronic infarction. Vascular: No hyperdense vessel or unexpected vascular calcification. Skull: The visualized skull base, calvarium and extracranial soft tissues are normal. CT MAXILLOFACIAL FINDINGS Osseous: --Complex facial fracture types: No LeFort, zygomaticomaxillary complex or nasoorbitoethmoidal fracture. --Simple fracture types: None. --Mandible, hard palate and teeth: No acute abnormality. Orbits: The globes and optic nerves are intact. Normal extraocular muscles and intraorbital fat. Sinuses: No acute finding. Soft tissues: Normal visualized extracranial soft tissues. IMPRESSION: 1. Normal head CT. 2. Normal orbits. Electronically Signed   By: Ulyses Jarred M.D.   On: 10/15/2019 20:35    Procedures Procedures (including critical care time)  Medications Ordered in ED Medications  sodium chloride 0.9 % bolus  1,000 mL (0 mLs Intravenous Stopped 10/15/19 2213)  metoCLOPramide (REGLAN) injection 10 mg (10 mg Intravenous Given 10/15/19 2044)  ketorolac (TORADOL) 30 MG/ML injection 30 mg (30 mg Intravenous Given 10/15/19 2210)    ED Course  I have reviewed the triage vital signs and the nursing notes.  Pertinent labs & imaging results that were available during my care of the patient were reviewed by me and considered in my medical decision making (see chart for details).    MDM Rules/Calculators/A&P                      I personally reviewed CT obtained of the head and orbits which demonstrated no evidence of acute pathology.  No overlying skin changes or proptosis that might otherwise suggest orbital cellulitis.  Laboratory work-up was entirely unremarkable.  On reexamination, she is feeling improved after IV NS bolus and Reglan.  We will add Toradol as her symptoms had not yet fully abated.  On reexamination, patient is feeling entirely improved.  She feels safe for discharge.  She will have her boyfriend pick her up.  Pt HA treated and improved while in ED.  Presentation is like pts typical HA and non concerning for [facility], ICH, Meningitis, or temporal arteritis.  No meningismus or high fevers otherwise concerning for meningitis.  Pt is afebrile with no focal neuro deficits, nuchal rigidity, or change in vision.  Pt is to follow up with PCP to discuss prophylactic medication.  Pt verbalizes understanding and is agreeable with plan to dc.  Offered lumbar puncture for more definitive evaluation for intracranial hemorrhage or meningitis, but patient declined.  Patient is tolerating food and liquids here in the ED without issue and she is in no acute distress.  Strict ED return precautions discussed.  All of the evaluation and work-up results were discussed with the patient and any family at bedside. They were provided opportunity to ask any additional questions and have none at this time. They have expressed  understanding of verbal discharge instructions as well as return precautions and are agreeable to the plan.   Final Clinical Impression(s) / ED Diagnoses Final diagnoses:  Bad headache    Rx / DC Orders ED Discharge Orders         Ordered    ibuprofen (ADVIL) 800 MG tablet  3 times daily     10/15/19 2238    acetaminophen (TYLENOL) 500  MG tablet  Every 6 hours PRN     10/15/19 2238           Lorelee New, PA-C 10/15/19 2241    Milagros Loll, MD 10/15/19 [phone]

## 2019-10-15 NOTE — ED Notes (Signed)
Pt given sandwich and something to drink.

## 2019-10-15 NOTE — Discharge Instructions (Addendum)
Please call the [facility] to get established with a primary care provider for ongoing evaluation and management of your health and wellbeing.  If these headaches continue to recur, you may ultimately benefit from neurology referral.  Please take your prescribed medications, as directed.  Discontinue the ibuprofen should you become pregnant or breast-feeding.  Please read the attachment on sinus rinses.  I would like you to continue taking your daily antihistamines as well as daily Flonase in the event that this is a sinus headache.  Please return to the ED or seek immediate medical attention should you experience any new or worsening symptoms.

## 2019-10-15 NOTE — ED Notes (Signed)
Pt transported to CT ?

## 2019-10-30 NOTE — Progress Notes (Signed)
Patient ID: Heather Schmidt, female    DOB: January 17, 1996, 24 y.o.   MRN: [id number]  PCP: Heather Malkin, MD  Chief Complaint  Patient presents with  . New Patient (Initial Visit)  . Establish Care  . Migraine    by her eyes  . Fatigue    if she sits for too long she goes to sleep, patient also mentioned craving salt    Subjective:   Heather Schmidt is a 24 y.o. female, presents to clinic with CC of the following:  Chief Complaint  Patient presents with  . New Patient (Initial Visit)  . Establish Care  . Migraine    by her eyes  . 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  She denied history of headaches or migraines. She noted that every time she walks around and moves her eyes she experiences worsening discomfort.    Denied any dizziness, blurred vision, runny nose or congestion, foreign body sensation in eye, itchy watery eyes, painful eye, red eye, sore throat, neck pain, meningismus, chest pain or difficulty breathing, difficulty swallowing, abdominal pain, nausea or vomiting, or other symptoms. CT obtained of the head and orbits which demonstrated no evidence of acute pathology.  No overlying skin changes or proptosis that might otherwise suggest orbital cellulitis.  Laboratory work-up was entirely unremarkable.  On reexamination, she is feeling improved after IV NS bolus and Reglan. Toradol was added and on reexamination, was feeling entirely improved.  Presentation was felt to be like pts typical HA and non concerning for Heather Schmidt, ICH, Meningitis, or temporal arteritis.  No meningismus or high fevers otherwise concerning for meningitis.  Pt was to  follow up with PCP to discuss prophylactic medication. The ER offered lumbar puncture for more definitive evaluation for intracranial hemorrhage or meningitis, but patient declined.  The vast majority of her medical visits over the past couple years have been with OB/GYN. Her most recent visit with them was 10/08/2019 with contraceptive counseling completed during that visit, and also screening for STDs and a Pap completed.  The Pap test was abnormal with plans to repeat in 1 year with cytology and HPV testing.  Also, her vitamin D level was slightly low, and an over-the-counter supplement recommended.  Patient noted that her headaches have not been all that problematic for her in the past.  She states she went to the ER due to that headache persisting for 3 to 4 days, which was unusual for her.  They do not occur all that commonly, and she is not very concerned about any medicines to prevent headaches.  When she gets an occasional headache, she does not often get nausea and vomiting with it, nor does it significantly limit activities. She has no prior diagnosis of migraine headaches.  A concern she raised today is that she sleeps too much.  She states this has been ever since she has been young.  She can sleep during class, if she does not have things to do during the day she often sleeps a lot during the day.  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  Usually then sleeps until about 9 or 10.  The other day she tries to get the bed earlier, by 10:00, and then often sleeps late as well.  I noted concerns with how that may affect sleep cycles and ability to get deeper sleep.  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  She states a lot of times she craves salt as well.  She notes she does try to stay well-hydrated when asked.  She notes that her classes at [employer] are all online now, and does not have to get up and about to attend  classes. She does not exercise, "not in a while "and is sedentary.  She has been feeling more depressed recently, more decreased interest and sadness.  She notes that she has been seen for depression in the past, + h/o seeing counselor/psychologist in her past in [place], < 1 year in duration, and was in 2016-2017, not felt helpful. Never been on meds to try to help. Her PHQ-9 was reviewed-16. 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  (As she learned that in females, ADHD can sometimes present with depressive symptoms).  I did note if she has concerns with that, she would need to have testing done at an outside center to help assess for that diagnosis.  Also, I noted the medicines to treat if they are stimulant based, are very controlled, and do have some side effect concerns as well. FH - neg for major depression  She also notes her EpiPen has expired and requests a refill.  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  She was told to always have an EpiPen on hand in case needed.  She states she has never used 1 in the past.  No tob use    Patient Active Problem List   Diagnosis Date Noted  . Fatigue 10/08/2019      Current Outpatient Medications:  .  albuterol (PROVENTIL HFA;VENTOLIN HFA) 108 (90 BASE) MCG/ACT inhaler, Inhale 2 puffs into the lungs every 6 (six) hours as needed for wheezing., Disp: , Rfl:  .  EPINEPHrine (EPIPEN JR) 0.15 MG/0.3ML injection, Inject 0.15 mg into the muscle as needed for anaphylaxis., Disp: , Rfl:    Allergies  Allergen Reactions  . Fish Allergy Anaphylaxis  . Peanut-Containing Drug Products Anaphylaxis  . Shellfish Allergy Anaphylaxis  . Other Cough    Ginger and barley     Past Surgical History:  Procedure Laterality  Date  . WISDOM TOOTH EXTRACTION       Family History  Problem Relation Age of Onset  . Asthma Mother   . Thyroid disease Mother   . Asthma Father   . Cancer Maternal Grandmother   . COPD Maternal Grandmother   . Hypertension Paternal Grandmother   . Hypertension Paternal Grandfather      Social History   Tobacco Use  . Smoking status: Never Smoker  . Smokeless tobacco: Never Used  Substance Use Topics  . Alcohol use: Yes    With staff assistance, above reviewed with the patient today.  ROS: As per HPI, otherwise no specific complaints on a limited and focused system review   No results found  for this or any previous visit (from the past 72 hour(s)).   PHQ2/9: Depression screen PHQ 2/9 10/31/2019  Decreased Interest 2  Down, Depressed, Hopeless 1  PHQ - 2 Score 3  Altered sleeping 3  Tired, decreased energy 3  Change in appetite 1  Feeling bad or failure about yourself  1  Trouble concentrating 3  Moving slowly or fidgety/restless 2  Suicidal thoughts 0  PHQ-9 Score 16  Difficult doing work/chores Somewhat difficult   PHQ-2/9 Result is +  Fall Risk: Fall Risk  10/31/2019  Number falls in past yr: 0  Injury with Fall? 0      Objective:   Vitals:   10/31/19 1317  Pulse: 87  Resp: 16  Temp: (!) 97.5 F (36.4 C)  TempSrc: Temporal  SpO2: 99%  Weight: 120 lb 1.6 oz (54.5 kg)  Height: 5' 1.75" (1.568 m)    Body mass index is 22.14 kg/m.  Physical Exam   NAD, masked, pleasant HEENT - [place]/AT, sclera anicteric, PERRL, EOMI, conj - non-inj'ed, TM's and canals clear, numerous piercings noted on the right outer ear, pharynx clear Neck - supple, no adenopathy, no TM, carotids 2+ and = without bruits bilat Car - RRR without m/g/r Pulm- RR and effort normal at rest, CTA without wheeze or rales Abd - soft, NT, ND, BS+,  no masses, no HSM Back - no CVA tenderness Skin- no rash noted on exposed areas, small tattoo on left wrist Ext - no LE edema, no active  joints Neuro/psychiatric - affect was not flat, appropriate with conversation  Alert and oriented  Grossly non-focal - good strength on testing extremities, sensation intact to LT in distal extremities, DTRs 2+ and equal in the patella, Romberg negative, no pronator drift, good finger-to-nose, gait normal with good tandem walk  Speech normal   Results for orders placed or performed during the hospital encounter of 10/15/19  Basic metabolic panel  Result Value Ref Range   Sodium 140 135 - 145 mmol/L   Potassium 3.6 3.5 - 5.1 mmol/L   Chloride 103 98 - 111 mmol/L   CO2 24 22 - 32 mmol/L   Glucose, Bld 87 70 - 99 mg/dL   BUN 7 6 - 20 mg/dL   Creatinine, Ser 0.35 0.44 - 1.00 mg/dL   Calcium 9.2 8.9 - 46.5 mg/dL   GFR calc non Af Amer >60 >60 mL/min   GFR calc Af Amer >60 >60 mL/min   Anion gap 13 5 - 15  CBC with Differential  Result Value Ref Range   WBC 5.9 4.0 - 10.5 K/uL   RBC 4.48 3.87 - 5.11 MIL/uL   Hemoglobin 12.9 12.0 - 15.0 g/dL   HCT 68.1 27.5 - 17.0 %   MCV 92.0 80.0 - 100.0 fL   MCH 28.8 26.0 - 34.0 pg   MCHC 31.3 30.0 - 36.0 g/dL   RDW 01.7 49.4 - 49.6 %   Platelets 210 150 - 400 K/uL   nRBC 0.0 0.0 - 0.2 %   Neutrophils Relative % 72 %   Neutro Abs 4.2 1.7 - 7.7 K/uL   Lymphocytes Relative 19 %   Lymphs Abs 1.1 0.7 - 4.0 K/uL   Monocytes Relative 9 %   Monocytes Absolute 0.5 0.1 - 1.0 K/uL   Eosinophils Relative 0 %   Eosinophils Absolute 0.0 0.0 - 0.5 K/uL   Basophils Relative 0 %   Basophils Absolute 0.0 0.0 - 0.1 K/uL   Immature Granulocytes 0 %  Abs Immature Granulocytes 0.01 0.00 - 0.07 K/uL  I-Stat Beta hCG blood, ED ([facility], [facility], [facility] only)  Result Value Ref Range   I-stat hCG, quantitative <5.0 <5 mIU/mL   Comment 3           10/09/19  Vitamin D level - 23.6   TSH - 0.816 HIV - NR, RPR - NR GC/Chlamydia - Neg PAP - ASC-US, HPV + Assessment & Plan:   1. Abnormal cervical Papanicolaou smear, unspecified abnormal pap finding She states she was not  aware of the abnormal Pap from her prior visit with gynecology, and share those results with her today and the recommendation of the gynecologist which was a follow-up in 1 years time for cytology and HPV testing.  Emphasized the importance of continuing to follow-up with GYN in a timely manner.  2. Vitamin D insufficiency She has not yet started the supplement, although she states she has vitamin D supplement at home to take.  She was not clear on the amount.  Reviewed the lab and that she is not markedly deficient, and recommended she take 800 to 1000 IUs of vitamin D daily presently.  3. Fatigue, unspecified type Discussed labs I often order to help assess, labs done here recently.  She was not anemic with a normal CBC, her basic metabolic panel was normal including electrolytes, glucose, and kidney function test, and her thyroid screen was normal.  Her vitamin D level was slightly below desired range, and noted I cannot attribute her fatigue due to this.  Her STD testing was negative. 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 Discussed her sedentary lifestyle recently may be contributing, and strongly encouraged her to try to get more active with some regular exercise several times a week.  Even just walking fast outside can help. Also noted that depressive symptoms can be contributing to fatigue.  I do think better management of her depression will be helpful.  4. Nonintractable episodic headache, unspecified headache type Aside from this recent headache that prompted an ER visit, she denies any major headache concerns in her past, and was not particularly concerned about any further management at this time for headaches.  We will continue to monitor.  5. Other depression Her PHQ-9 was  reviewed, and I do feel she has concerns for depression.  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  She did not feel counseling was particularly helpful, although she noted it may be the counselor that was involved.  I did note how counseling plus medications worked better than either alone and did encourage counseling to help. I also discussed medicines at length, and did feel adding a medicine would be helpful to assess.  She was a little hesitant with potential side effects of these medicines, and reviewed them with her today.  Often can get some gastrointestinal side effects at the beginning, but often after a week to lessen and resolve.  She also noted that these medicines in the past raise concerns for people been wanting to kill themselves, and I did discuss those concerns that prompted a black box warning in the past, although they have lessened with the benefit  of the medicine felt to be much greater than any risk of this, and we will start with a low dose to assess.  She did agree to a trial of starting a medicine, and sertraline was recommended.  We will start with the 50 mg dose, and did note she can start by taking half of a 50 mg tablet for a week, and then increasing to a full tablet after to help minimize any potential early side effects.  I also noted it will take several weeks to assess any potential benefits from the medicine. - sertraline (ZOLOFT) 50 MG tablet; Take 1 tablet (50 mg total) by mouth daily.  Dispense: 30 tablet; Refill: 1 I also noted better history and symptoms seem more consistent with depression and ADHD, as she asked about that, I did note if she or any counselor involved has more concerns with potential ADHD, I will ask for outside testing to try to help assess for that diagnosis.  6. History of anaphylaxis She requested a refill of an EpiPen, and  1 was provided today.  She has not needed to use in the past, although was told to always have one available.  Did note concerns with the cost of this in the recent past, and if the cost is too great, she will likely not fill, and will let us know to see if there are potentially other options try to help in this realm. - EPINEPHrine 0.3 mg/0.3 mL IJ SOAJ injection; Inject 0.3 mLs (0.3 mg total) into the muscle as needed for anaphylaxis.  Dispense: 1 each; Refill: 1   She will follow-up in 4 to 6 weeks time, sooner as needed I did review her recent labs, and did not feel any further lab tests were needed today.    Jamelle Haring, MD 10/31/19 1:30 PM

## 2019-10-31 ENCOUNTER — Other Ambulatory Visit: Payer: Self-pay

## 2019-10-31 ENCOUNTER — Ambulatory Visit (INDEPENDENT_AMBULATORY_CARE_PROVIDER_SITE_OTHER): Payer: Managed Care, Other (non HMO) | Admitting: Internal Medicine

## 2019-10-31 ENCOUNTER — Encounter: Payer: Self-pay | Admitting: Internal Medicine

## 2019-10-31 VITALS — BP 92/60 | HR 87 | Temp 97.5°F | Resp 16 | Ht 61.75 in | Wt 120.1 lb

## 2019-10-31 DIAGNOSIS — R87619 Unspecified abnormal cytological findings in specimens from cervix uteri: Secondary | ICD-10-CM

## 2019-10-31 DIAGNOSIS — F329 Major depressive disorder, single episode, unspecified: Secondary | ICD-10-CM | POA: Insufficient documentation

## 2019-10-31 DIAGNOSIS — R5383 Other fatigue: Secondary | ICD-10-CM | POA: Diagnosis not present

## 2019-10-31 DIAGNOSIS — Z87892 Personal history of anaphylaxis: Secondary | ICD-10-CM

## 2019-10-31 DIAGNOSIS — F32A Depression, unspecified: Secondary | ICD-10-CM | POA: Insufficient documentation

## 2019-10-31 DIAGNOSIS — R519 Headache, unspecified: Secondary | ICD-10-CM | POA: Insufficient documentation

## 2019-10-31 DIAGNOSIS — E559 Vitamin D deficiency, unspecified: Secondary | ICD-10-CM

## 2019-10-31 DIAGNOSIS — F3289 Other specified depressive episodes: Secondary | ICD-10-CM

## 2019-10-31 MED ORDER — SERTRALINE HCL 50 MG PO TABS: 50.0000 mg | ORAL_TABLET | Freq: Every day | ORAL | 1 refills | Status: DC

## 2019-10-31 MED ORDER — EPINEPHRINE 0.3 MG/0.3ML IJ SOAJ: 0.3000 mg | INTRAMUSCULAR | 1 refills | Status: DC | PRN

## 2019-10-31 NOTE — Patient Instructions (Signed)
Take 800 to 1000 IU's of a vitamin D supplement daily

## 2019-11-03 ENCOUNTER — Telehealth: Payer: Self-pay | Admitting: *Deleted

## 2019-11-03 NOTE — Telephone Encounter (Signed)
Returned pt's call regarding her pap results, she was unaware that her pap was abnormal, that we informed her that her results where normal and her PCP told her that her pap was abnormal. Apologized to pt and then explained her pap results in detail and recommendations to repeat in one year.  Pt feels much better and did have an area that she thinks could be related to her HPV. Told pt it would be best to get her back in so that a provider can check that area and we could treat if needed. Pt verbalizes and made an appointment for follow up.

## 2019-11-19 ENCOUNTER — Ambulatory Visit (INDEPENDENT_AMBULATORY_CARE_PROVIDER_SITE_OTHER): Payer: Managed Care, Other (non HMO) | Admitting: Family Medicine

## 2019-11-19 ENCOUNTER — Ambulatory Visit: Payer: Managed Care, Other (non HMO) | Admitting: Family Medicine

## 2019-11-19 ENCOUNTER — Encounter: Payer: Self-pay | Admitting: Family Medicine

## 2019-11-19 ENCOUNTER — Other Ambulatory Visit (HOSPITAL_COMMUNITY)
Admission: RE | Admit: 2019-11-19 | Discharge: 2019-11-19 | Disposition: A | Discharge status: Home / Self Care | Payer: Managed Care, Other (non HMO) | Source: Ambulatory Visit | Attending: Family Medicine | Admitting: Family Medicine

## 2019-11-19 ENCOUNTER — Other Ambulatory Visit: Payer: Self-pay

## 2019-11-19 VITALS — BP 104/68 | HR 74 | Wt 120.0 lb

## 2019-11-19 DIAGNOSIS — K5901 Slow transit constipation: Secondary | ICD-10-CM | POA: Diagnosis not present

## 2019-11-19 DIAGNOSIS — N898 Other specified noninflammatory disorders of vagina: Secondary | ICD-10-CM | POA: Insufficient documentation

## 2019-11-19 DIAGNOSIS — B9689 Other specified bacterial agents as the cause of diseases classified elsewhere: Secondary | ICD-10-CM

## 2019-11-19 DIAGNOSIS — K649 Unspecified hemorrhoids: Secondary | ICD-10-CM

## 2019-11-19 DIAGNOSIS — R21 Rash and other nonspecific skin eruption: Secondary | ICD-10-CM

## 2019-11-19 DIAGNOSIS — N760 Acute vaginitis: Secondary | ICD-10-CM

## 2019-11-19 NOTE — Progress Notes (Signed)
   GYNECOLOGY PROBLEM  VISIT ENCOUNTER NOTE  Subjective:   Heather Schmidt is a 24 y.o. G0P0000 female here for a a problem GYN visit.  Current complaints: spot below vagina- present for a couple weeks, unsure if it is hemorrhoid. Reports history of long standing constipation- has BM every couple days sometimes does longer. Drinks 4 bottles of water daily. Reviewed recent pap results and need for repeat screening. Patient also noted itchy lesion on her arm that is like her normal eczema but seemed worse. Has not tried any OTC remedies for area.  Denies abnormal vaginal bleeding, discharge, pelvic pain, problems with intercourse or other gynecologic concerns.    Gynecologic History No LMP recorded. Contraception: condoms  There are no preventive care reminders to display for this patient.   The following portions of the patient's history were reviewed and updated as appropriate: allergies, current medications, past family history, past medical history, past social history, past surgical history and problem list.  Review of Systems Pertinent items are noted in HPI.   Objective:  BP 104/68   Pulse 74   Wt 120 lb (54.4 kg)   BMI 22.13 kg/m  Gen: well appearing, NAD HEENT: no scleral icterus CV: RR Lung: Normal WOB Ext: warm well perfused  PELVIC: Normal appearing external genitalia; normal appearing vaginal mucosa and cervix.  No abnormal discharge noted.   Normal uterine size, no other palpable masses, no uterine or adnexal tenderness.  RECTAL: Hemorrhoid present at 12 oclock position  Left arm: healing erythematous patch with mild excoriation.    Assessment and Plan:  1. Vaginal discharge Declines CT/GC/Trich screening. Only wants BV and candida screening. Has had 1 new partner but uses condoms 100% of the time. I recommended repeat STI screening, declines today. Counseled that is BV/candida negative would recommend further testing - Cervicovaginal ancillary only( [facility])  2. Rash Likely eczema. Counseled on use of emollients and lotion. Reviewed use of OTC hydorcortisone. Recommended PCP care if not improved by above  3. Slow transit constipation Long standing Reviewed importance of water and regular fiber rich foods Discussed use of dietary fiber supplement Reviewed contributing to hemorrhoids  4. Hemorrhoids, unspecified hemorrhoid type recommended OTC treatment  >50% of this 30 minute visit was spent counseling about STI prevention, management of eczema, management of constipation and contribution to hemorrhoids  Please refer to After Visit Summary for other counseling recommendations.   Return if symptoms worsen or fail to improve.  Federico Flake, MD, MPH, ABFM Attending Physician Faculty Practice[facility]

## 2019-11-19 NOTE — Progress Notes (Signed)
Bump on vaginal area

## 2019-11-21 LAB — CERVICOVAGINAL ANCILLARY ONLY
Bacterial Vaginitis (gardnerella): POSITIVE — AB
Candida Glabrata: NEGATIVE
Candida Vaginitis: NEGATIVE
Comment: NEGATIVE
Comment: NEGATIVE
Comment: NEGATIVE

## 2019-11-21 MED ORDER — METRONIDAZOLE 500 MG PO TABS: 500.0000 mg | ORAL_TABLET | Freq: Two times a day (BID) | ORAL | 0 refills | Status: DC

## 2019-11-21 NOTE — Addendum Note (Signed)
Addended by: Geanie Berlin on: 11/21/2019 04:37 PM   Modules accepted: Orders

## 2019-12-04 NOTE — Progress Notes (Signed)
Patient ID: [name], female    DOB: [birth year], 24 y.o.   MRN: [id number]  PCP: Towanda Malkin, MD  Chief Complaint  Patient presents with  . Follow-up  . Depression    f/u or sertraline, patient states she does not feel a difference    Subjective:   Jasmeet Manton is a 24 y.o. female, presents to clinic with CC of the following:  Chief Complaint  Patient presents with  . Follow-up  . Depression    f/u or sertraline, patient states she does not feel a difference    HPI:  Patient is a 24 year old female who I first met on 10/31/2019 and she established with the practice. Sertraline was started at that visit for depression concerns. She follows up today.  Depression Medication regimen-sertraline 50 mg daily Notes has been compliant with taking.  No side effect concerns. Notes not helping with symptoms.  She notes she has really noticed no change in her symptoms since our last visit.  They have not worsened, although she has not noted much improvement. Not currently seeing counseling, did note last visit she had a therapist outside of the [place] environment that she had seen previously, although did not feel that the counselor was particularly helpful.  She notes to get counseling involved she has to go through her [employer], but she did not she could get counseling involved and after strong encouragement, stated she will do so.  She noted she would prefer not to utilize any counseling within the university setting.  The PHQ-9 and GAD-7's were reviewed today. They were both very positive, and she denied any frank panic attacks, no increasing heart rates, shortness of breath symptoms, nausea or vomiting or physical symptoms with her increased anxiety.  I noted concerns with her checking 1 for at times feeling better off dead, and she assured me that she has no active suicidal thoughts or plans. Notes that she is still sleeping all the time,  although has difficulty at times getting to sleep at nighttime.  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  Fatigue Initial lab work-up was unremarkable, with her slightly low vitamin D level the only abnormality noted and did not feel was a major contributor to her fatigue. She has not had significant increase in her physical activity. She notes that this has not worsened at all since our last visit.  Nonintractable episodic headache - she denied any major headache concerns in her past, and was not particularly concerned about any further management last visit for her headaches.  She noted she had a headache yesterday, but it did not last very long, and has not had problems with headaches since our last visit.  Patient Active Problem List   Diagnosis Date Noted  . Generalized anxiety disorder 12/05/2019  . Abnormal cervical Papanicolaou smear 10/31/2019  . Vitamin D insufficiency 10/31/2019  . Nonintractable episodic headache 10/31/2019  . Depression 10/31/2019  . History of anaphylaxis 10/31/2019  . Fatigue 10/08/2019      Current Outpatient Medications:  .  albuterol (PROVENTIL HFA;VENTOLIN HFA) 108 (90 BASE) MCG/ACT inhaler, Inhale 2 puffs into the lungs every 6 (six) hours as needed for wheezing., Disp: , Rfl:  .  metroNIDAZOLE (FLAGYL) 500 MG tablet, Take 1 tablet (500 mg total) by mouth 2 (two) times daily., Disp: 14 tablet, Rfl: 0 .  sertraline (  ZOLOFT) 100 MG tablet, Take 1 tablet (100 mg total) by mouth daily., Disp: 90 tablet, Rfl: 1 .  EPINEPHrine 0.3 mg/0.3 mL IJ SOAJ injection, Inject 0.3 mLs (0.3 mg total) into the muscle as needed for anaphylaxis. (Patient not taking: Reported on 12/05/2019), Disp: 1 each, Rfl: 1   Allergies  Allergen Reactions  . Fish Allergy Anaphylaxis  . Peanut-Containing Drug Products Anaphylaxis  . Shellfish Allergy Anaphylaxis  . Other Cough     Ginger and barley     Past Surgical History:  Procedure Laterality Date  . WISDOM TOOTH EXTRACTION       Family History  Problem Relation Age of Onset  . Asthma Mother   . Thyroid disease Mother   . Asthma Father   . Cancer Maternal Grandmother   . COPD Maternal Grandmother   . Hypertension Paternal Grandmother   . Hypertension Paternal Grandfather      Social History   Tobacco Use  . Smoking status: Never Smoker  . Smokeless tobacco: Never Used  Substance Use Topics  . Alcohol use: Yes    With staff assistance, above reviewed with the patient today.  ROS: As per HPI, otherwise no specific complaints on a limited and focused system review   No results found for this or any previous visit (from the past 72 hour(s)).   PHQ2/9: Depression screen [facility] 2/9 12/05/2019 10/31/2019  Decreased Interest 3 2  Down, Depressed, Hopeless 1 1  PHQ - 2 Score 4 3  Altered sleeping 3 3  Tired, decreased energy 3 3  Change in appetite 3 1  Feeling bad or failure about yourself  1 1  Trouble concentrating 3 3  Moving slowly or fidgety/restless 3 2  Suicidal thoughts 1 0  PHQ-9 Score 21 16  Difficult doing work/chores Somewhat difficult Somewhat difficult   PHQ-2/9 Result revewed  GAD 7 : Generalized Anxiety Score 12/05/2019  Nervous, Anxious, on Edge 2  Control/stop worrying 3  Worry too much - different things 3  Trouble relaxing 3  Restless 3  Easily annoyed or irritable 3  Afraid - awful might happen 3  Total GAD 7 Score 20  Anxiety Difficulty Very difficult   Result reviewed  Both very positive   Fall Risk: Fall Risk  12/05/2019 10/31/2019  Falls in the past year? 0 -  Number falls in past yr: 0 0  Injury with Fall? 0 0      Objective:   Vitals:   12/05/19 1324  BP: 104/60  Pulse: 85  Resp: 16  Temp: 98.2 F (36.8 C)  TempSrc: Temporal  SpO2: 98%  Weight: 117 lb 12.8 oz (53.4 kg)  Height: 5' 1.75" (1.568 m)    Body mass index is 21.72  kg/m.  Physical Exam   NAD, masked, pleasant HEENT - [place]/AT, sclera anicteric, PERRL, EOMI, conj - non-inj'ed,  Neck - supple,  Car - RRR without m/g/r Pulm- RR and effort normal at rest, CTA without wheeze or rales Abd - soft, NT diffusely Ext - no LE edema,  Neuro/psychiatric - affect was not markedly flat, appropriate with conversation  Alert and oriented, speech was normal with no rapid speech.    Results for orders placed or performed in visit on 11/19/19  Cervicovaginal ancillary only( [place])  Result Value Ref Range   Bacterial Vaginitis (gardnerella) Positive (A)    Candida Vaginitis Negative    Candida Glabrata Negative    Comment Normal Reference Range Candida Species - Negative  Comment Normal Reference Range Candida Galbrata - Negative    Comment      Normal Reference Range Bacterial Vaginosis - Negative       Assessment & Plan:   1. Other depression No real improvement since our last visit, although it has not worsened to any extent per her reports.  Her PHQ-9 was a little more concerning as noted above, although she assured me today that she has no active suicidal thoughts or plans, and would not hurt herself. Discussed options at length, and having psychiatry involved sooner than later was discussed.  Did strongly encourage counseling involvement as well, and she agreed and will contact the counseling to set up an appointment. Discussed increasing the current medicine, possibly changing to a different medicine, and mutually agreed to increase the sertraline to 100 mg daily presently. We will hold off on adding another medicine like trazodone at nighttime, and await response to the increase in the sertraline today. We will hold off on psychiatry referral today, as she noted she did not think she needed psychiatry involved, and would prefer to have counseling involved initially and await her response with the increase sertraline and counseling's  involvement. Also assured if she has any concerns in the interim prior to our follow-up, there are phone numbers to call, and asked she follow-up with a contact here here, and if off hours there are numbers to call that she was aware of. - sertraline (ZOLOFT) 100 MG tablet; Take 1 tablet (100 mg total) by mouth daily.  Dispense: 90 tablet; Refill: 1  2. Fatigue, unspecified type Do feel that her depressive symptoms are contributing, and has not progressed.  As above, initial work-up was unremarkable with lab test, and continue to monitor as await her response to the above. 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 Generalized anxiety disorder Her GAD-7 today was very positive as well, and does have anxiety with her depression. Do feel the sertraline can help, and await her response.  Follow-up again in 4 weeks time, sooner as needed.        Towanda Malkin, MD 12/05/19 1:47 PM

## 2019-12-05 ENCOUNTER — Other Ambulatory Visit: Payer: Self-pay

## 2019-12-05 ENCOUNTER — Encounter: Payer: Self-pay | Admitting: Internal Medicine

## 2019-12-05 ENCOUNTER — Ambulatory Visit (INDEPENDENT_AMBULATORY_CARE_PROVIDER_SITE_OTHER): Payer: Managed Care, Other (non HMO) | Admitting: Internal Medicine

## 2019-12-05 VITALS — BP 104/60 | HR 85 | Temp 98.2°F | Resp 16 | Ht 61.75 in | Wt 117.8 lb

## 2019-12-05 DIAGNOSIS — F411 Generalized anxiety disorder: Secondary | ICD-10-CM

## 2019-12-05 DIAGNOSIS — F3289 Other specified depressive episodes: Secondary | ICD-10-CM | POA: Diagnosis not present

## 2019-12-05 DIAGNOSIS — R5383 Other fatigue: Secondary | ICD-10-CM | POA: Diagnosis not present

## 2019-12-05 MED ORDER — SERTRALINE HCL 100 MG PO TABS: 100.0000 mg | ORAL_TABLET | Freq: Every day | ORAL | 1 refills | Status: DC

## 2019-12-05 NOTE — Patient Instructions (Signed)
Please get counseling involved as we discussed our visit

## 2019-12-25 ENCOUNTER — Other Ambulatory Visit: Payer: Self-pay

## 2019-12-25 ENCOUNTER — Encounter: Payer: Self-pay | Admitting: Physician Assistant

## 2019-12-25 ENCOUNTER — Ambulatory Visit: Payer: Managed Care, Other (non HMO)

## 2019-12-25 ENCOUNTER — Ambulatory Visit: Payer: Self-pay | Admitting: Physician Assistant

## 2019-12-25 DIAGNOSIS — Z113 Encounter for screening for infections with a predominantly sexual mode of transmission: Secondary | ICD-10-CM

## 2019-12-25 NOTE — Progress Notes (Signed)
Wet mount reviewed, no tx per provider orders. Provider orders completed. 

## 2019-12-25 NOTE — Progress Notes (Signed)
[facility] Department STI clinic/screening visit  Subjective:  Heather Schmidt is a 24 y.o. female being seen today for an STI screening visit. The patient reports they do not have symptoms.  Patient reports that they do not desire a pregnancy in the next year.   They reported they are not interested in discussing contraception today.  No LMP recorded (lmp unknown).   Patient has the following medical conditions:   Patient Active Problem List   Diagnosis Date Noted  . Generalized anxiety disorder 12/05/2019  . Abnormal cervical Papanicolaou smear 10/31/2019  . Vitamin D insufficiency 10/31/2019  . Nonintractable episodic headache 10/31/2019  . Depression 10/31/2019  . History of anaphylaxis 10/31/2019  . Fatigue 10/08/2019    Chief Complaint  Patient presents with  . SEXUALLY TRANSMITTED DISEASE    STD screening including bloodwork    HPI  Patient reports new partner last night and wants STI screen. Prior sex 1 mo ago. Uses condoms. States menses are irregular and thinks last period was last month. States period apps make her anxious and she forgets to document her periods. Denies having sex against her will.   Last HIV test per patient/review of record was 10/08/19 Patient reports last pap was 10/08/19.   See flowsheet for further details and programmatic requirements.    The following portions of the patient's history were reviewed and updated as appropriate: allergies, current medications, past medical history, past social history, past surgical history and problem list.  Objective:  There were no vitals filed for this visit.  Physical Exam Vitals and nursing note reviewed.  Constitutional:      Appearance: Normal appearance.  HENT:     Head: Normocephalic and atraumatic.     Mouth/Throat:     Mouth: Mucous membranes are moist.     Pharynx: Oropharynx is clear. No oropharyngeal exudate or posterior oropharyngeal erythema.  Pulmonary:     Effort:  Pulmonary effort is normal.  Abdominal:     General: Abdomen is flat.     Palpations: There is no mass.     Tenderness: There is no abdominal tenderness. There is no rebound.  Genitourinary:    General: Normal vulva.     Exam position: Lithotomy position.     Pubic Area: No rash or pubic lice.      Labia:        Right: No rash or lesion.        Left: No rash or lesion.      Vagina: Normal. No vaginal discharge, erythema, bleeding or lesions.     Cervix: No cervical motion tenderness, discharge, friability, lesion or erythema.     Uterus: Normal.      Adnexa: Right adnexa normal and left adnexa normal.     Rectum: Normal. No internal hemorrhoid.     Comments: Vag pH < 4.5 Lymphadenopathy:     Head:     Right side of head: No preauricular or posterior auricular adenopathy.     Left side of head: No preauricular or posterior auricular adenopathy.     Cervical: No cervical adenopathy.     Upper Body:     Right upper body: No supraclavicular or axillary adenopathy.     Left upper body: No supraclavicular or axillary adenopathy.     Lower Body: No right inguinal adenopathy. No left inguinal adenopathy.  Skin:    General: Skin is warm and dry.     Findings: No rash.     Comments: Tattoos/piercings  noted  Neurological:     Mental Status: She is alert and oriented to person, place, and time.  Psychiatric:        Behavior: Behavior normal.        Thought Content: Thought content normal.        Judgment: Judgment normal.      Assessment and Plan:  Heather Schmidt is a 24 y.o. female presenting to the [facility] for STI screening  1. Screen for STD (sexually transmitted disease) Please give multivitamin with folic acid to take 1 po qd. At risk for pregnancy and STI. Declines more statistically reliable BCM than condoms. Enc to continue condoms (declines more today.) - WET PREP FOR TRICH, YEAST, CLUE - HIV [place] [facility] - Syphilis Serology, [place]  Lab - Chlamydia/Gonorrhea [place] [facility]     No follow-ups on file.  Future Appointments  Date Time Provider [place]  01/02/2020  1:20 PM Towanda Malkin, MD [facility]    Lora Havens, [place]

## 2019-12-26 LAB — WET PREP FOR TRICH, YEAST, CLUE
Trichomonas Exam: NEGATIVE
Yeast Exam: NEGATIVE

## 2020-01-02 ENCOUNTER — Ambulatory Visit: Payer: Managed Care, Other (non HMO) | Admitting: Internal Medicine

## 2020-03-03 ENCOUNTER — Other Ambulatory Visit: Payer: Self-pay

## 2020-03-30 ENCOUNTER — Other Ambulatory Visit: Payer: Self-pay

## 2020-03-30 ENCOUNTER — Encounter: Payer: Self-pay | Admitting: Family Medicine

## 2020-03-30 ENCOUNTER — Ambulatory Visit (INDEPENDENT_AMBULATORY_CARE_PROVIDER_SITE_OTHER): Payer: Managed Care, Other (non HMO) | Admitting: Family Medicine

## 2020-03-30 ENCOUNTER — Other Ambulatory Visit (HOSPITAL_COMMUNITY)
Admission: RE | Admit: 2020-03-30 | Discharge: 2020-03-30 | Disposition: A | Discharge status: Home / Self Care | Payer: Managed Care, Other (non HMO) | Source: Ambulatory Visit | Attending: Family Medicine | Admitting: Family Medicine

## 2020-03-30 VITALS — BP 109/69 | HR 71

## 2020-03-30 DIAGNOSIS — E559 Vitamin D deficiency, unspecified: Secondary | ICD-10-CM

## 2020-03-30 DIAGNOSIS — R5383 Other fatigue: Secondary | ICD-10-CM | POA: Diagnosis not present

## 2020-03-30 DIAGNOSIS — F3289 Other specified depressive episodes: Secondary | ICD-10-CM

## 2020-03-30 DIAGNOSIS — N914 Secondary oligomenorrhea: Secondary | ICD-10-CM | POA: Diagnosis not present

## 2020-03-30 DIAGNOSIS — Z113 Encounter for screening for infections with a predominantly sexual mode of transmission: Secondary | ICD-10-CM | POA: Diagnosis not present

## 2020-03-30 DIAGNOSIS — F411 Generalized anxiety disorder: Secondary | ICD-10-CM

## 2020-03-30 DIAGNOSIS — L2082 Flexural eczema: Secondary | ICD-10-CM

## 2020-03-30 NOTE — Progress Notes (Signed)
    Subjective:    Patient ID: Heather Schmidt is a 24 y.o. female presenting with Hemorrhoids  on 03/30/2020  HPI: Large hemorrhoid, was hurting. Used preparation H. Still there but not as big. Has constipation since Condom came off inside her. Wants UPT. Using condoms most of the time. 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 Has a tendency to fall asleep in class and has since a young child. Concerned she might have Narcolepsy. Has eczema.   Review of Systems  Constitutional: Negative for chills and fever.  Respiratory: Negative for shortness of breath.   Cardiovascular: Negative for chest pain.  Gastrointestinal: Negative for abdominal pain, nausea and vomiting.  Genitourinary: Negative for dysuria.  Skin: Negative for rash.      Objective:    BP 109/69   Pulse 71  Physical Exam Constitutional:      General: She is not in acute distress.    Appearance: She is well-developed.  HENT:     Head: Normocephalic and atraumatic.  Eyes:     General: No scleral icterus. Cardiovascular:     Rate and Rhythm: Normal rate.  Pulmonary:     Effort: Pulmonary effort is normal.  Abdominal:     Palpations: Abdomen is soft.  Musculoskeletal:     Cervical back: Neck supple.  Skin:    General: Skin is warm and dry.  Neurological:     Mental Status: She is alert and oriented to person, place, and time.         Assessment & Plan:   Problem List Items Addressed This Visit      Unprioritized   Fatigue    Sleep study to r/o narcolepsy      Relevant Orders   Ambulatory referral to Sleep Studies   Vitamin D insufficiency    Vitamin D supplementation over the counter is fine      Depression    Mindfulness training, add in exercise, yoga      Generalized anxiety disorder    Mindfulness, yoga, and exercise      Secondary oligomenorrhea   Relevant Orders   POCT urine pregnancy   Eczema    Dove soap only. Lukewarm showers, pat dry. Eucerin or Lubriderm applied  daily. Topical hydrocortisone to darkened and troubling areas.        Other Visit Diagnoses    Screen for STD (sexually transmitted disease)    -  Primary   Relevant Orders   Cervicovaginal ancillary only      Total time in review of prior notes, pathology, labs, history taking, review with patient, exam, note writing, discussion of options, plan for next steps, alternatives and risks of treatment: 31 minutes.  Return in about 6 months (around 09/27/2020).  Heather Schmidt 03/31/2020 7:53 PM

## 2020-03-30 NOTE — Progress Notes (Signed)
When pt made appt her hemorrhoid was worse, it is now better.  Pt requesting STD screen and UPT

## 2020-03-31 DIAGNOSIS — N914 Secondary oligomenorrhea: Secondary | ICD-10-CM | POA: Insufficient documentation

## 2020-03-31 DIAGNOSIS — L309 Dermatitis, unspecified: Secondary | ICD-10-CM | POA: Insufficient documentation

## 2020-03-31 NOTE — Assessment & Plan Note (Signed)
Mindfulness, yoga, and exercise

## 2020-03-31 NOTE — Assessment & Plan Note (Signed)
Dove soap only. Lukewarm showers, pat dry. Eucerin or Lubriderm applied daily. Topical hydrocortisone to darkened and troubling areas.

## 2020-03-31 NOTE — Assessment & Plan Note (Addendum)
Vitamin D supplementation over the counter is fine

## 2020-03-31 NOTE — Assessment & Plan Note (Signed)
Mindfulness training, add in exercise, yoga

## 2020-03-31 NOTE — Assessment & Plan Note (Signed)
Sleep study to r/o narcolepsy

## 2020-04-01 ENCOUNTER — Encounter: Payer: Self-pay | Admitting: Family Medicine

## 2020-04-01 LAB — POCT URINE PREGNANCY: Preg Test, Ur: NEGATIVE

## 2020-04-01 LAB — CERVICOVAGINAL ANCILLARY ONLY
Bacterial Vaginitis (gardnerella): NEGATIVE
Candida Glabrata: NEGATIVE
Candida Vaginitis: POSITIVE — AB
Chlamydia: NEGATIVE
Comment: NEGATIVE
Comment: NEGATIVE
Comment: NEGATIVE
Comment: NEGATIVE
Comment: NEGATIVE
Comment: NORMAL
Neisseria Gonorrhea: NEGATIVE
Trichomonas: NEGATIVE

## 2020-04-02 MED ORDER — FLUCONAZOLE 150 MG PO TABS: 150.0000 mg | ORAL_TABLET | Freq: Every day | ORAL | 2 refills | Status: AC

## 2020-04-02 NOTE — Addendum Note (Signed)
Addended by: Reva Bores on: 04/02/2020 08:05 AM   Modules accepted: Orders

## 2020-04-27 ENCOUNTER — Ambulatory Visit (INDEPENDENT_AMBULATORY_CARE_PROVIDER_SITE_OTHER): Payer: Managed Care, Other (non HMO) | Admitting: *Deleted

## 2020-04-27 ENCOUNTER — Other Ambulatory Visit: Payer: Self-pay

## 2020-04-27 VITALS — BP 99/64 | HR 71

## 2020-04-27 DIAGNOSIS — R3989 Other symptoms and signs involving the genitourinary system: Secondary | ICD-10-CM

## 2020-04-27 LAB — POCT URINALYSIS DIPSTICK

## 2020-04-27 MED ORDER — NITROFURANTOIN MONOHYD MACRO 100 MG PO CAPS: 100.0000 mg | ORAL_CAPSULE | Freq: Two times a day (BID) | ORAL | 0 refills | Status: DC

## 2020-04-27 MED ORDER — PHENAZOPYRIDINE HCL 200 MG PO TABS: 200.0000 mg | ORAL_TABLET | Freq: Three times a day (TID) | ORAL | 1 refills | Status: DC | PRN

## 2020-04-27 NOTE — Progress Notes (Signed)
Patient was assessed and managed by nursing staff during this encounter. I have reviewed the chart and agree with the documentation and plan. I have also made any necessary editorial changes.  Jaynie Collins, MD 04/27/2020 4:35 PM

## 2020-04-27 NOTE — Progress Notes (Signed)
SUBJECTIVE: Heather Schmidt is a 24 y.o. female who complains of urinary frequency, urgency and dysuria x 4  days, without flank pain, fever, chills, or abnormal vaginal discharge or bleeding.   OBJECTIVE: Appears well, in no apparent distress.  Vital signs are normal. Urine dipstick shows positive for RBC's and positive for leukocytes.    ASSESSMENT: Dysuria  PLAN: Treatment per orders.  Call or return to clinic prn if these symptoms worsen or fail to improve as anticipated.

## 2020-05-01 LAB — URINE CULTURE

## 2020-05-17 ENCOUNTER — Telehealth: Payer: Self-pay | Admitting: *Deleted

## 2020-05-17 MED ORDER — SULFAMETHOXAZOLE-TRIMETHOPRIM 800-160 MG PO TABS: 1.0000 | ORAL_TABLET | Freq: Two times a day (BID) | ORAL | 0 refills | Status: AC

## 2020-05-17 NOTE — Telephone Encounter (Signed)
Pt called stating her UTI has not cleared up, informed pt that I would reach out to a provider and see if we can send in additional medication.

## 2020-05-17 NOTE — Telephone Encounter (Signed)
Informed pt that we will send in another antibiotic to take and if its not better she will need to be seen. Pt verbalizes and understands.

## 2020-06-23 DIAGNOSIS — U071 COVID-19: Secondary | ICD-10-CM

## 2020-06-23 HISTORY — DX: COVID-19: U07.1

## 2020-06-24 ENCOUNTER — Ambulatory Visit: Payer: Managed Care, Other (non HMO) | Admitting: Obstetrics & Gynecology

## 2020-07-12 ENCOUNTER — Ambulatory Visit (INDEPENDENT_AMBULATORY_CARE_PROVIDER_SITE_OTHER): Payer: Managed Care, Other (non HMO) | Admitting: Obstetrics & Gynecology

## 2020-07-12 ENCOUNTER — Other Ambulatory Visit: Payer: Self-pay

## 2020-07-12 ENCOUNTER — Encounter: Payer: Self-pay | Admitting: Obstetrics & Gynecology

## 2020-07-12 ENCOUNTER — Other Ambulatory Visit (HOSPITAL_COMMUNITY)
Admission: RE | Admit: 2020-07-12 | Discharge: 2020-07-12 | Disposition: A | Discharge status: Home / Self Care | Payer: Managed Care, Other (non HMO) | Source: Ambulatory Visit | Attending: Obstetrics & Gynecology | Admitting: Obstetrics & Gynecology

## 2020-07-12 VITALS — BP 94/62 | HR 82 | Wt 114.0 lb

## 2020-07-12 DIAGNOSIS — B373 Candidiasis of vulva and vagina: Secondary | ICD-10-CM | POA: Diagnosis not present

## 2020-07-12 DIAGNOSIS — N939 Abnormal uterine and vaginal bleeding, unspecified: Secondary | ICD-10-CM | POA: Insufficient documentation

## 2020-07-12 DIAGNOSIS — Z113 Encounter for screening for infections with a predominantly sexual mode of transmission: Secondary | ICD-10-CM | POA: Diagnosis not present

## 2020-07-12 DIAGNOSIS — B3731 Acute candidiasis of vulva and vagina: Secondary | ICD-10-CM

## 2020-07-12 NOTE — Progress Notes (Signed)
   GYNECOLOGY OFFICE VISIT NOTE  History:   Heather Schmidt is a 25 y.o. G0P0000 here today for episode of vaginal spotting that started 06/08/2020 and lasted for two weeks prior to period that followed. Period was lighter than normal. No cramping or any other symptoms. No bleeding since the period. Of note, was diagnosed with COVID on 06/23/2020, mild symptoms. Never had irregular bleeding.  Sexually active with one partner, uses condoms for contraception/STI prevention. She denies any current abnormal vaginal discharge, bleeding, pelvic pain or other concerns.    Past Medical History:  Diagnosis Date  . Asthma   . Chlamydia 06/06/2019  . COVID-19 06/23/2020    Past Surgical History:  Procedure Laterality Date  . WISDOM TOOTH EXTRACTION      The following portions of the patient's history were reviewed and updated as appropriate: allergies, current medications, past family history, past medical history, past social history, past surgical history and problem list.   Health Maintenance:  ASCUS with positive HRHPV on 10/08/2019.  Review of Systems:  Pertinent items noted in HPI and remainder of comprehensive ROS otherwise negative.  Physical Exam:  BP 94/62   Pulse 82   Wt 114 lb (51.7 kg)   BMI 21.02 kg/m  CONSTITUTIONAL: Well-developed, well-nourished female in no acute distress.  HEENT:  Normocephalic, atraumatic. External right and left ear normal. No scleral icterus.  NECK: Normal range of motion, supple, no masses noted on observation SKIN: No rash noted. Not diaphoretic. No erythema. No pallor. MUSCULOSKELETAL: Normal range of motion. No edema noted. NEUROLOGIC: Alert and oriented to person, place, and time. Normal muscle tone coordination. No cranial nerve deficit noted. PSYCHIATRIC: Normal mood and affect. Normal behavior. Normal judgment and thought content. CARDIOVASCULAR: Normal heart rate noted RESPIRATORY: Effort and breath sounds normal, no problems with  respiration noted ABDOMEN: No masses noted. No other overt distention noted.   PELVIC: Deferred     Assessment and Plan:     1. Vaginal spotting Isolated episode of prolonged spotting. Likely due to stress; but will evaluate for other etiologies.  will follow up results and manage accordingly. If abnormal bleeding continues, will get pelvic ultrasound or do other evaluation.  - TSH+Prl+TestT+TestF+17OHP - Beta hCG quant (ref lab) - Hepatitis B surface antigen - Hepatitis C antibody - Cervicovaginal ancillary only - HIV Antibody (routine testing w rflx) - RPR Routine preventative health maintenance measures emphasized, patient is aware of need for repeat pap in 10/2020. Please refer to After Visit Summary for other counseling recommendations.   Return in about 3 months (around 10/07/2020) for Repeat pap (had abnormal in 2021) or earlier if needed.    I spent 20 minutes dedicated to the care of this patient including pre-visit review of records, face to face time with the patient discussing her conditions and treatments and post visit ordering of testing.    Jaynie Collins, MD, FACOG Obstetrician & Gynecologist, [facility] for [employer], [facility]

## 2020-07-12 NOTE — Patient Instructions (Signed)
You will need a repeat pap smear sometime after 10/07/2020   Abnormal Uterine Bleeding  Abnormal uterine bleeding is unusual bleeding from the uterus. It includes bleeding after sex, or bleeding or spotting between menstrual periods. It may also include bleeding that is heavier than normal, menstrual periods that last longer than usual, or bleeding that occurs after menopause. Abnormal uterine bleeding can affect teenagers, women in their reproductive years, pregnant women, and women who have reached menopause. Common causes of abnormal uterine bleeding include:  Pregnancy.  Growths of tissue (polyps).  Benign tumors or growths in the uterus (fibroids). These are not cancer.  Infection.  Cancer.  Too much or too little of some hormones in the body (hormonal imbalances). Any type of abnormal bleeding should be checked by a health care provider. Many cases are minor and simple to treat, but others may be more serious. Treatment will depend on the cause and severity of the bleeding. Follow these instructions at home: Medicines  Take over-the-counter and prescription medicines only as told by your health care provider.  Tell your health care provider about other medicines that you take. You may be asked to stop taking aspirin or medicines that contain aspirin. These medicines can make bleeding worse.  If you were prescribed iron pills, take them as told by your health care provider. Iron pills help to replace iron that your body loses because of this condition. Managing constipation In cases of severe bleeding, you may be asked to increase your iron intake to treat anemia. This may cause constipation. To prevent or treat constipation, you may need to:  Drink enough fluid to keep your urine pale yellow.  Take over-the-counter or prescription medicines.  Eat foods that are high in fiber, such as beans, whole grains, and fresh fruits and vegetables.  Limit foods that are high in fat and  processed sugars, such as fried or sweet foods. General instructions  Monitor your condition for any changes.  Do not use tampons, douche, or have sex until your health care provider says these things are okay.  Change your pads often.  Get regular exams. This includes pelvic exams and cervical cancer screenings. ? It is up to you to get the results of any tests that are done. Ask your health care provider, or the department that is doing the tests, when your results will be ready.  Keep all follow-up visits as told by your health care provider. This is important. Contact a health care provider if you:  Have bleeding that lasts for more than 1 week.  Feel dizzy at times.  Feel nauseous or you vomit.  Feel light-headed or weak.  Notice any other changes that show that your condition is getting worse. Get help right away if you:  Pass out.  Have bleeding that soaks through a pad every hour.  Have pain in the abdomen.  Have a fever or chills.  Become sweaty or weak.  Pass large blood clots from your vagina. Summary  Abnormal uterine bleeding is unusual bleeding from the uterus.  Any type of abnormal bleeding should be evaluated by a health care provider. Many cases are minor and simple to treat, but others may be more serious.  Treatment will depend on the cause of the bleeding.  Get help right away if you pass out, you have bleeding that soaks through a pad every hour, or you pass large blood clots from your vagina. This information is not intended to replace advice given to you  by your health care provider. Make sure you discuss any questions you have with your health care provider. Document Revised: 02/25/2020 Document Reviewed: 04/22/2019 Elsevier Patient Education  2021 [employer].

## 2020-07-13 ENCOUNTER — Encounter: Payer: Self-pay | Admitting: Obstetrics & Gynecology

## 2020-07-13 LAB — CERVICOVAGINAL ANCILLARY ONLY
Bacterial Vaginitis (gardnerella): NEGATIVE
Candida Glabrata: NEGATIVE
Candida Vaginitis: POSITIVE — AB
Chlamydia: NEGATIVE
Comment: NEGATIVE
Comment: NEGATIVE
Comment: NEGATIVE
Comment: NEGATIVE
Comment: NEGATIVE
Comment: NORMAL
Neisseria Gonorrhea: NEGATIVE
Trichomonas: NEGATIVE

## 2020-07-13 MED ORDER — FLUCONAZOLE 150 MG PO TABS: 150.0000 mg | ORAL_TABLET | Freq: Once | ORAL | 3 refills | Status: AC

## 2020-07-13 NOTE — Addendum Note (Signed)
Addended by: Jaynie Collins A on: 07/13/2020 01:23 PM   Modules accepted: Orders

## 2020-07-20 ENCOUNTER — Encounter: Payer: Self-pay | Admitting: *Deleted

## 2020-07-21 LAB — BETA HCG QUANT (REF LAB): hCG Quant: <1 m[IU]/mL

## 2020-07-21 LAB — TSH+PRL+TESTT+TESTF+17OHP
17-Hydroxyprogesterone: 333 ng/dL
Prolactin: 18 ng/mL (ref 4.8–23.3)
TSH: 1.02 u[IU]/mL (ref 0.450–4.500)
Testosterone, Free: 4.2 pg/mL (ref 0.0–4.2)
Testosterone, Total, LC/MS: 56.4 ng/dL — ABNORMAL HIGH (ref 10.0–55.0)

## 2020-07-21 LAB — RPR: RPR Ser Ql: NONREACTIVE

## 2020-07-21 LAB — HEPATITIS B SURFACE ANTIGEN: Hepatitis B Surface Ag: NEGATIVE

## 2020-07-21 LAB — HIV ANTIBODY (ROUTINE TESTING W REFLEX): HIV Screen 4th Generation wRfx: NONREACTIVE

## 2020-07-21 LAB — HEPATITIS C ANTIBODY: Hep C Virus Ab: <0.1 s/co ratio (ref 0.0–0.9)

## 2020-08-24 ENCOUNTER — Encounter: Payer: Self-pay | Admitting: Radiology

## 2020-09-01 ENCOUNTER — Ambulatory Visit (INDEPENDENT_AMBULATORY_CARE_PROVIDER_SITE_OTHER): Payer: Managed Care, Other (non HMO) | Admitting: *Deleted

## 2020-09-01 ENCOUNTER — Other Ambulatory Visit (HOSPITAL_COMMUNITY)
Admission: RE | Admit: 2020-09-01 | Discharge: 2020-09-01 | Disposition: A | Discharge status: Home / Self Care | Payer: Managed Care, Other (non HMO) | Source: Ambulatory Visit | Attending: Advanced Practice Midwife | Admitting: Advanced Practice Midwife

## 2020-09-01 ENCOUNTER — Other Ambulatory Visit: Payer: Self-pay

## 2020-09-01 VITALS — BP 104/66 | HR 83

## 2020-09-01 DIAGNOSIS — Z113 Encounter for screening for infections with a predominantly sexual mode of transmission: Secondary | ICD-10-CM

## 2020-09-01 DIAGNOSIS — B373 Candidiasis of vulva and vagina: Secondary | ICD-10-CM | POA: Insufficient documentation

## 2020-09-01 NOTE — Progress Notes (Signed)
SUBJECTIVE:  25 y.o. female requesting STI screen.Denies abnormal vaginal bleeding or significant pelvic pain or fever. No UTI symptoms. Denies history of known exposure to STD.  No LMP recorded.  OBJECTIVE:  She appears well, afebrile. Urine dipstick: not done.  ASSESSMENT:  STI screen    PLAN:  GC, chlamydia, trichomonas, BVAG, CVAG probe sent to lab. Treatment: To be determined once lab results are received ROV prn if symptoms persist or worsen.

## 2020-09-01 NOTE — Progress Notes (Signed)
ATTESTATION OF SUPERVISION OF RN: Evaluation and management procedures were performed by the RN under my supervision and collaboration. I have reviewed the nursing note and chart and agree with the management and plan for this patient.  Dontel Harshberger, CNM  

## 2020-09-02 ENCOUNTER — Other Ambulatory Visit: Payer: Self-pay | Admitting: *Deleted

## 2020-09-02 ENCOUNTER — Telehealth: Payer: Self-pay | Admitting: *Deleted

## 2020-09-02 LAB — CERVICOVAGINAL ANCILLARY ONLY
Bacterial Vaginitis (gardnerella): NEGATIVE
Candida Glabrata: NEGATIVE
Candida Vaginitis: POSITIVE — AB
Chlamydia: NEGATIVE
Comment: NEGATIVE
Comment: NEGATIVE
Comment: NEGATIVE
Comment: NEGATIVE
Comment: NEGATIVE
Comment: NORMAL
Neisseria Gonorrhea: NEGATIVE
Trichomonas: NEGATIVE

## 2020-09-02 MED ORDER — FLUCONAZOLE 150 MG PO TABS: 150.0000 mg | ORAL_TABLET | Freq: Once | ORAL | 1 refills | Status: AC

## 2020-09-02 NOTE — Telephone Encounter (Signed)
Pt informed of results and medication sent in .  

## 2020-09-02 NOTE — Telephone Encounter (Signed)
-----   Message from Calvert Cantor, [place] sent at 09/02/2020  2:03 PM EST ----- Yeast positive. Please send Diflucan. Thank you! SW

## 2020-10-20 ENCOUNTER — Ambulatory Visit: Payer: Managed Care, Other (non HMO) | Admitting: Family Medicine

## 2020-11-10 ENCOUNTER — Encounter: Payer: Self-pay | Admitting: Advanced Practice Midwife

## 2020-11-10 ENCOUNTER — Ambulatory Visit (INDEPENDENT_AMBULATORY_CARE_PROVIDER_SITE_OTHER): Payer: Managed Care, Other (non HMO) | Admitting: Advanced Practice Midwife

## 2020-11-10 ENCOUNTER — Other Ambulatory Visit: Payer: Self-pay

## 2020-11-10 ENCOUNTER — Other Ambulatory Visit (HOSPITAL_COMMUNITY)
Admission: RE | Admit: 2020-11-10 | Discharge: 2020-11-10 | Disposition: A | Discharge status: Home / Self Care | Payer: Managed Care, Other (non HMO) | Source: Ambulatory Visit | Attending: Advanced Practice Midwife | Admitting: Advanced Practice Midwife

## 2020-11-10 VITALS — BP 105/66 | HR 75 | Ht 62.0 in | Wt 119.2 lb

## 2020-11-10 DIAGNOSIS — Z3202 Encounter for pregnancy test, result negative: Secondary | ICD-10-CM | POA: Diagnosis not present

## 2020-11-10 DIAGNOSIS — K640 First degree hemorrhoids: Secondary | ICD-10-CM | POA: Diagnosis not present

## 2020-11-10 DIAGNOSIS — Z01419 Encounter for gynecological examination (general) (routine) without abnormal findings: Secondary | ICD-10-CM | POA: Diagnosis not present

## 2020-11-10 DIAGNOSIS — Z308 Encounter for other contraceptive management: Secondary | ICD-10-CM | POA: Diagnosis not present

## 2020-11-10 DIAGNOSIS — Z113 Encounter for screening for infections with a predominantly sexual mode of transmission: Secondary | ICD-10-CM | POA: Diagnosis not present

## 2020-11-10 LAB — POCT URINE PREGNANCY: Preg Test, Ur: NEGATIVE

## 2020-11-10 MED ORDER — DOCUSATE SODIUM 100 MG PO CAPS: 100.0000 mg | ORAL_CAPSULE | Freq: Two times a day (BID) | ORAL | 2 refills | Status: DC | PRN

## 2020-11-10 NOTE — Progress Notes (Signed)
Negative UPT

## 2020-11-10 NOTE — Progress Notes (Signed)
GYNECOLOGY ANNUAL PREVENTATIVE CARE ENCOUNTER NOTE  History:     Heather Schmidt is a 25 y.o. G0P0000 female here for a routine annual gynecologic exam.  Current complaints: none.   Denies abnormal vaginal bleeding, discharge, pelvic pain, problems with intercourse or other gynecologic concerns.  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 Sexually active, 1 female partner, + penetrative sex.    Gynecologic History Patient's last menstrual period was 10/19/2020 (approximate). Contraception: condoms Last Pap: 10/2019. Results were: ASCUS with positive HRHPV ( negative 16, 18/45)  Obstetric History OB History  Gravida Para Term Preterm AB Living  0 0 0 0 0 0  SAB IAB Ectopic Multiple Live Births  0 0 0 0 0    Past Medical History:  Diagnosis Date  . Asthma   . Chlamydia 06/06/2019  . COVID-19 06/23/2020    Past Surgical History:  Procedure Laterality Date  . WISDOM TOOTH EXTRACTION      Current Outpatient Medications on File Prior to Visit  Medication Sig Dispense Refill  . albuterol (PROVENTIL HFA;VENTOLIN HFA) 108 (90 BASE) MCG/ACT inhaler Inhale 2 puffs into the lungs every 6 (six) hours as needed for wheezing. (Patient not taking: Reported on 11/10/2020)    . EPINEPHrine 0.3 mg/0.3 mL IJ SOAJ injection Inject 0.3 mLs (0.3 mg total) into the muscle as needed for anaphylaxis. (Patient not taking: Reported on 12/05/2019) 1 each 1  . nitrofurantoin, macrocrystal-monohydrate, (MACROBID) 100 MG capsule Take 1 capsule (100 mg total) by mouth 2 (two) times daily. (Patient not taking: Reported on 11/10/2020) 10 capsule 0  . phenazopyridine (PYRIDIUM) 200 MG tablet Take 1 tablet (200 mg total) by mouth 3 (three) times daily as needed for pain (urethral spasm). (Patient not taking: Reported on 11/10/2020) 10 tablet 1   No current facility-administered medications on file prior to visit.    Allergies  Allergen Reactions  . Fish Allergy Anaphylaxis  .  Peanut-Containing Drug Products Anaphylaxis  . Shellfish Allergy Anaphylaxis  . Egg [Eggs Or Egg-Derived Products] Nausea And Vomiting  . Other Cough    Ginger and barley  . Latex Rash    Social History:  reports that she has never smoked. She has never used smokeless tobacco. She reports current alcohol use. She reports current drug use. Drug: Marijuana.  Family History  Problem Relation Age of Onset  . Asthma Mother   . Thyroid disease Mother   . Asthma Father   . Cancer Maternal Grandmother   . COPD Maternal Grandmother   . Hypertension Paternal Grandmother   . Hypertension Paternal Grandfather     The following portions of the patient's history were reviewed and updated as appropriate: allergies, current medications, past family history, past medical history, past social history, past surgical history and problem list.  Review of Systems Pertinent items noted in HPI and remainder of comprehensive ROS otherwise negative.  Physical Exam:  BP 105/66   Pulse 75   Ht 5\' 2"  (1.575 m)   Wt 119 lb 3.2 oz (54.1 kg)   LMP 10/19/2020 (Approximate)   BMI 21.80 kg/m  CONSTITUTIONAL: Well-developed, well-nourished female in no acute distress.  HENT:  Normocephalic, atraumatic, External right and left ear normal.  EYES: Conjunctivae and EOM are normal. Pupils are equal, round, and reactive to light. No scleral icterus.  NECK: Normal range of motion, supple, no masses.  Normal thyroid.  SKIN: Skin is warm and dry. No rash noted. Not diaphoretic. No erythema. No pallor.  MUSCULOSKELETAL: Normal range of motion. No tenderness.  No cyanosis, clubbing, or edema. NEUROLOGIC: Alert and oriented to person, place, and time. Normal reflexes, muscle tone coordination.  PSYCHIATRIC: Normal mood and affect. Normal behavior. Normal judgment and thought content. CARDIOVASCULAR: Normal heart rate noted, regular rhythm RESPIRATORY: Clear to auscultation bilaterally. Effort and breath sounds normal, no  problems with respiration noted. BREASTS: Symmetric in size. No masses, tenderness, skin changes, nipple drainage, or lymphadenopathy bilaterally. Performed in the presence of a chaperone. ABDOMEN: Soft, no distention noted.  No tenderness, rebound or guarding.  PELVIC: Normal appearing external genitalia and urethral meatus; normal appearing vaginal mucosa and cervix.  No abnormal discharge noted.  Pap smear obtained. Performed in the presence of a chaperone.   Assessment and Plan:    1. Well woman exam with routine gynecological exam - No abnormal findings - STI screening per patient request - Hepatitis C antibody - Hepatitis B surface antigen - HIV Antibody (routine testing w rflx) - RPR - Cytology - PAP - POCT urine pregnancy  2. Grade I hemorrhoids - Not visualized today - Discussed new regiment for management. Continue Preparation H as needed, add Colace, Miralax, high fiber diet  Will follow up results of pap smear and manage accordingly. Routine preventative health maintenance measures emphasized. Please refer to After Visit Summary for other counseling recommendations.      Clayton Bibles, MSN, CNM Certified Nurse Midwife, Biochemist, clinical for [employer], [facility]

## 2020-11-12 LAB — HEPATITIS C ANTIBODY: Hep C Virus Ab: 0.1 s/co ratio (ref 0.0–0.9)

## 2020-11-12 LAB — HEPATITIS B SURFACE ANTIGEN: Hepatitis B Surface Ag: NEGATIVE

## 2020-11-12 LAB — RPR: RPR Ser Ql: NONREACTIVE

## 2020-11-12 LAB — HIV ANTIBODY (ROUTINE TESTING W REFLEX): HIV Screen 4th Generation wRfx: NONREACTIVE

## 2020-11-15 LAB — CYTOLOGY - PAP
Chlamydia: NEGATIVE
Comment: NEGATIVE
Comment: NEGATIVE
Comment: NORMAL
Diagnosis: NEGATIVE
Neisseria Gonorrhea: NEGATIVE
Trichomonas: NEGATIVE

## 2021-03-17 ENCOUNTER — Ambulatory Visit: Payer: Managed Care, Other (non HMO)

## 2021-03-22 ENCOUNTER — Ambulatory Visit: Payer: Managed Care, Other (non HMO)

## 2021-03-28 ENCOUNTER — Other Ambulatory Visit: Payer: Self-pay

## 2021-03-28 ENCOUNTER — Other Ambulatory Visit
Admission: RE | Admit: 2021-03-28 | Discharge: 2021-03-28 | Disposition: A | Discharge status: Home / Self Care | Payer: Managed Care, Other (non HMO) | Source: Ambulatory Visit | Attending: Family Medicine | Admitting: Family Medicine

## 2021-03-28 DIAGNOSIS — Z20822 Contact with and (suspected) exposure to covid-19: Secondary | ICD-10-CM | POA: Insufficient documentation

## 2021-03-28 DIAGNOSIS — Z01812 Encounter for preprocedural laboratory examination: Secondary | ICD-10-CM | POA: Diagnosis present

## 2021-03-28 LAB — SARS CORONAVIRUS 2 (TAT 6-24 HRS): SARS Coronavirus 2: NEGATIVE

## 2021-03-29 ENCOUNTER — Ambulatory Visit: Payer: Managed Care, Other (non HMO) | Attending: Neurology

## 2021-03-29 DIAGNOSIS — G4730 Sleep apnea, unspecified: Secondary | ICD-10-CM | POA: Diagnosis present

## 2021-03-30 ENCOUNTER — Ambulatory Visit: Payer: Managed Care, Other (non HMO) | Attending: Neurology

## 2021-03-30 DIAGNOSIS — G4730 Sleep apnea, unspecified: Secondary | ICD-10-CM | POA: Diagnosis not present

## 2021-03-30 DIAGNOSIS — R5383 Other fatigue: Secondary | ICD-10-CM | POA: Diagnosis not present

## 2021-03-30 DIAGNOSIS — G4710 Hypersomnia, unspecified: Secondary | ICD-10-CM | POA: Insufficient documentation

## 2021-03-30 DIAGNOSIS — R0683 Snoring: Secondary | ICD-10-CM | POA: Diagnosis not present

## 2021-04-05 ENCOUNTER — Other Ambulatory Visit: Payer: Self-pay

## 2021-04-08 ENCOUNTER — Encounter: Payer: Self-pay | Admitting: Radiology

## 2021-05-02 ENCOUNTER — Telehealth: Payer: Self-pay | Admitting: Family Medicine

## 2021-05-02 NOTE — Telephone Encounter (Signed)
-----   Message from Clovis Riley, RN sent at 04/28/2021  9:46 AM EDT ----- Regarding: FW: sleep study Her results are scanned in ----- Message ----- From: Danna Hefty D Sent: 04/27/2021   3:17 PM EDT To: [facility] [place] Clinical [facility] Subject: sleep study                                    This pt received her sleep study results and she wants to go over them. I advised her to call the sleep study staff and she stated that she wants to discuss them with dr Josemaria Brining she has an appt for nov 30 at 330

## 2021-05-02 NOTE — Telephone Encounter (Signed)
Discussed with patient that her sleep study is c/w narcolepsy, will work on referral for treatment.

## 2021-05-03 ENCOUNTER — Other Ambulatory Visit: Payer: Self-pay | Admitting: *Deleted

## 2021-05-03 DIAGNOSIS — G47411 Narcolepsy with cataplexy: Secondary | ICD-10-CM

## 2021-05-06 ENCOUNTER — Other Ambulatory Visit: Payer: Self-pay | Admitting: *Deleted

## 2021-05-06 DIAGNOSIS — G47411 Narcolepsy with cataplexy: Secondary | ICD-10-CM

## 2021-05-11 ENCOUNTER — Other Ambulatory Visit: Payer: Self-pay | Admitting: *Deleted

## 2021-05-11 DIAGNOSIS — G47411 Narcolepsy with cataplexy: Secondary | ICD-10-CM

## 2021-06-01 ENCOUNTER — Encounter: Payer: Self-pay | Admitting: Family Medicine

## 2021-06-01 ENCOUNTER — Other Ambulatory Visit: Payer: Self-pay

## 2021-06-01 ENCOUNTER — Ambulatory Visit (INDEPENDENT_AMBULATORY_CARE_PROVIDER_SITE_OTHER): Payer: Managed Care, Other (non HMO) | Admitting: Family Medicine

## 2021-06-01 ENCOUNTER — Other Ambulatory Visit (HOSPITAL_COMMUNITY)
Admission: RE | Admit: 2021-06-01 | Discharge: 2021-06-01 | Disposition: A | Discharge status: Home / Self Care | Payer: Managed Care, Other (non HMO) | Source: Ambulatory Visit | Attending: Family Medicine | Admitting: Family Medicine

## 2021-06-01 VITALS — BP 108/70 | HR 80 | Wt 127.0 lb

## 2021-06-01 DIAGNOSIS — Z113 Encounter for screening for infections with a predominantly sexual mode of transmission: Secondary | ICD-10-CM

## 2021-06-01 DIAGNOSIS — G47419 Narcolepsy without cataplexy: Secondary | ICD-10-CM | POA: Diagnosis not present

## 2021-06-01 NOTE — Progress Notes (Signed)
Pt states she has a swollen lymph node in her vaginal area x 2 wks now  Has recently started to go away. Pt is requesting STD screening/BV and yeast Wants full panel STD Labs as well.  Pt notices a change in vaginal odor no discharge.   Contraception : None and does not want any at this time.
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
## 2021-06-02 LAB — HIV ANTIBODY (ROUTINE TESTING W REFLEX): HIV Screen 4th Generation wRfx: NONREACTIVE

## 2021-06-02 LAB — HEPATITIS B SURFACE ANTIGEN: Hepatitis B Surface Ag: NEGATIVE

## 2021-06-02 LAB — HEPATITIS C ANTIBODY: Hep C Virus Ab: 0.1 s/co ratio (ref 0.0–0.9)

## 2021-06-02 LAB — RPR: RPR Ser Ql: NONREACTIVE

## 2021-06-03 LAB — CERVICOVAGINAL ANCILLARY ONLY
Bacterial Vaginitis (gardnerella): POSITIVE — AB
Candida Glabrata: NEGATIVE
Candida Vaginitis: NEGATIVE
Chlamydia: NEGATIVE
Comment: NEGATIVE
Comment: NEGATIVE
Comment: NEGATIVE
Comment: NEGATIVE
Comment: NEGATIVE
Comment: NORMAL
Neisseria Gonorrhea: NEGATIVE
Trichomonas: NEGATIVE

## 2021-06-06 MED ORDER — METRONIDAZOLE 500 MG PO TABS: 500.0000 mg | ORAL_TABLET | Freq: Two times a day (BID) | ORAL | 0 refills | Status: DC

## 2021-06-06 NOTE — Addendum Note (Signed)
Addended by: Reva Bores on: 06/06/2021 10:07 AM   Modules accepted: Orders

## 2021-06-08 ENCOUNTER — Telehealth: Payer: Self-pay

## 2021-06-08 ENCOUNTER — Institutional Professional Consult (permissible substitution): Payer: Managed Care, Other (non HMO) | Admitting: Internal Medicine

## 2021-06-08 NOTE — Telephone Encounter (Signed)
Pt was scheduled to see Kasa on 12/7 but he does not manage or treat narcolepsy. Called and spoke to patient scheduled her for 1/5 with Dr Vassie Loll at 2:45. Patient in agreement. Nothing further needed.

## 2021-06-27 ENCOUNTER — Encounter: Payer: Self-pay | Admitting: Family Medicine

## 2021-07-07 ENCOUNTER — Ambulatory Visit (INDEPENDENT_AMBULATORY_CARE_PROVIDER_SITE_OTHER): Payer: BC Managed Care – PPO | Admitting: Pulmonary Disease

## 2021-07-07 ENCOUNTER — Other Ambulatory Visit: Payer: Self-pay

## 2021-07-07 ENCOUNTER — Encounter: Payer: Self-pay | Admitting: Pulmonary Disease

## 2021-07-07 DIAGNOSIS — G47419 Narcolepsy without cataplexy: Secondary | ICD-10-CM

## 2021-07-07 MED ORDER — MODAFINIL 200 MG PO TABS: 200.0000 mg | ORAL_TABLET | Freq: Every day | ORAL | 1 refills | Status: DC

## 2021-07-07 NOTE — Assessment & Plan Note (Signed)
She does not seem to have any evidence of sleep disordered breathing on a nocturnal polysomnogram or any other cause of hypersomnolence.  She does have 3/5 SOR EMS and very short sleep latency which would be consistent with a diagnosis of narcolepsy.  She does not seem to have any history of cataplexy.  She has been an occasional episode of sleep paralysis. I emphasized to her that the mainstay of treatment of hypersomnolence in narcolepsy is frequent naps.  I explained to her the importance of getting at least 1 hour naps twice during the day we will give her a letter for work if necessary. Also emphasized the need for exercise and maintaining a sleep schedule and trying to avoid excessive caffeinated drinks or alcohol which could mess with her sleep schedule.  We will prescribe her with a stimulant such as Provigil 20 mg with seems to be covered with her insurance.  I have advised her to start taking this at 7 AM in the mornings.  We will reassess her in 1 month and see how long this effect lasts and if she is able to incorporate naps in the day.  I have also asked her to keep her sleep diary.  If needed, we can add on a second dose in the afternoons
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
## 2021-07-07 NOTE — Patient Instructions (Signed)
°  You have narcolepsy  Nap x 1h x twice during the day every 4-5h  X Rx for nuvigil

## 2021-07-27 ENCOUNTER — Other Ambulatory Visit: Payer: Self-pay

## 2021-07-27 ENCOUNTER — Ambulatory Visit (INDEPENDENT_AMBULATORY_CARE_PROVIDER_SITE_OTHER): Payer: BC Managed Care – PPO | Admitting: Dermatology

## 2021-07-27 DIAGNOSIS — L308 Other specified dermatitis: Secondary | ICD-10-CM

## 2021-07-27 DIAGNOSIS — L908 Other atrophic disorders of skin: Secondary | ICD-10-CM

## 2021-07-27 DIAGNOSIS — L819 Disorder of pigmentation, unspecified: Secondary | ICD-10-CM

## 2021-07-27 DIAGNOSIS — L909 Atrophic disorder of skin, unspecified: Secondary | ICD-10-CM

## 2021-07-27 MED ORDER — OPZELURA 1.5 % EX CREA: 1.0000 "application " | TOPICAL_CREAM | Freq: Two times a day (BID) | CUTANEOUS | 2 refills | Status: DC

## 2021-07-27 NOTE — Progress Notes (Signed)
° °  New Patient Visit  Subjective  Heather Schmidt is a 26 y.o. female who presents for the following: Skin Problem (Pt here for eczema flare on her arms, knees treating with Triamcinolone cream with poor response prescribed by her previous dermatologist ).  The following portions of the chart were reviewed this encounter and updated as appropriate:   Tobacco   Allergies   Meds   Problems   Med Hx   Surg Hx   Fam Hx      Review of Systems:  No other skin or systemic complaints except as noted in HPI or Assessment and Plan.  Objective  Well appearing patient in no apparent distress; mood and affect are within normal limits.  A focused examination was performed including face,arms,legs. Relevant physical exam findings are noted in the Assessment and Plan.  arms,legs Scaly erythematous papules and patches +/- dyspigmentation, lichenification, excoriations, spotty hypopigmentation          Left Flank Atrophy         Assessment & Plan  Eczema arms,legs  Atopic dermatitis (eczema) is a chronic, relapsing, pruritic condition that can significantly affect quality of life. It is often associated with allergic rhinitis and/or asthma and can require treatment with topical medications, phototherapy, or in severe cases biologic injectable medication (Dupixent; Adbry) or Oral JAK inhibitors.   Start Opzelura cream apply to affected skin twice a day, Discussed with pt Opzelura is not safe during pregnancy   Related Medications Ruxolitinib Phosphate (OPZELURA) 1.5 % CREA Apply 1 application topically 2 (two) times daily. Not safe to use on pregnant patients  Atrophy of skin -fat atrophy with hypopigmentation of the left superior buttock Left Flank Atrophy with hypopigmentation possibly from a previous IM steroid injection -patient does not recall getting an IM steroid injection but says she may have. This may improve on its own.  Return in about 1 year (around 07/27/2022) for  Eczema .  IAngelique Holm, CMA, am acting as scribe for Armida Sans, MD .  Documentation: I have reviewed the above documentation for accuracy and completeness, and I agree with the above.  Armida Sans, MD

## 2021-07-27 NOTE — Patient Instructions (Signed)

## 2021-07-31 ENCOUNTER — Encounter: Payer: Self-pay | Admitting: Dermatology

## 2021-08-04 ENCOUNTER — Encounter: Payer: Self-pay | Admitting: Internal Medicine

## 2021-08-04 ENCOUNTER — Other Ambulatory Visit: Payer: Self-pay

## 2021-08-04 ENCOUNTER — Ambulatory Visit (INDEPENDENT_AMBULATORY_CARE_PROVIDER_SITE_OTHER): Payer: BC Managed Care – PPO | Admitting: Internal Medicine

## 2021-08-04 VITALS — BP 112/66 | HR 108 | Temp 98.1°F | Resp 16 | Ht 61.0 in | Wt 121.6 lb

## 2021-08-04 DIAGNOSIS — R8290 Unspecified abnormal findings in urine: Secondary | ICD-10-CM

## 2021-08-04 DIAGNOSIS — J069 Acute upper respiratory infection, unspecified: Secondary | ICD-10-CM | POA: Diagnosis not present

## 2021-08-04 DIAGNOSIS — R051 Acute cough: Secondary | ICD-10-CM

## 2021-08-04 LAB — POCT URINALYSIS DIPSTICK
Bilirubin, UA: NEGATIVE
Glucose, UA: NEGATIVE
Ketones, UA: NEGATIVE
Leukocytes, UA: NEGATIVE
Nitrite, UA: NEGATIVE
Odor: NORMAL
Protein, UA: NEGATIVE
Spec Grav, UA: 1.01 (ref 1.010–1.025)
Urobilinogen, UA: 0.2 E.U./dL
pH, UA: 5 (ref 5.0–8.0)

## 2021-08-04 MED ORDER — BENZONATATE 100 MG PO CAPS: 100.0000 mg | ORAL_CAPSULE | Freq: Two times a day (BID) | ORAL | 0 refills | Status: DC | PRN

## 2021-08-04 NOTE — Patient Instructions (Addendum)
It was great seeing you today!  Plan discussed at today's visit: -Cough medication sent to pharmacy but symptoms most likely due to a viral illness. Make sure to rest, drink plenty of fluids and use honey/cough drops for throat pain of Lidocaine lozenges.  -Would recommend doing nasal saline and Flonase to help with congestion -If you change your mind about steroids please give me a call.  Follow up in: as needed or if symptoms worsen or fail to improve  Take care and let us know if you have any questions or concerns prior to your next visit.  Dr. Caralee Ates

## 2021-08-04 NOTE — Progress Notes (Signed)
Acute Office Visit  Subjective:    Patient ID: Heather Schmidt, female    DOB: [birth year], 26 y.o.   MRN: [id number]  Chief Complaint  Patient presents with   Cough   Sore Throat   Nasal Congestion    HPI Patient is in today for sore throat. When to Urgent Care 2 days ago and was tested for flu, COVID and strep which were apparently all negative. Does have a history of well controlled asthma, using Albuterol as needed.  URI Compliant: symptoms stared 5 days ago -Worst symptom: sore throat, cough and nasal congestion  -Fever:  subjective, doesn't have thermometer -Cough: yes dry  -Shortness of breath: no -Wheezing: no -Chest pain: no -Chest tightness: yes -Chest congestion: no -Nasal congestion: yes -Runny nose: yes, clear -Post nasal drip: yes -Sore throat: yes -Sinus pressure: no -Headache: yes -Ear pain: no  -Ear pressure: no  -Vomiting: no -Sick contacts: no  -Context: better -Relief with OTC cold/cough medications: no  -Treatments attempted: pseudoephedrine and cough syrup, albuterol as needed  URINARY SYMPTOMS Dysuria: no Urinary frequency: no Urgency: no Small volume voids: no Urinary incontinence: no Foul odor: yes Hematuria: no Abdominal pain:  resolved Back pain: no Suprapubic pain/pressure: no Flank pain: no Fever:  subjective Vomiting: no  Past Medical History:  Diagnosis Date   Asthma    Chlamydia 06/06/2019   COVID-19 06/23/2020    Past Surgical History:  Procedure Laterality Date   WISDOM TOOTH EXTRACTION      Family History  Problem Relation Age of Onset   Asthma Mother    Thyroid disease Mother    Asthma Father    Cancer Maternal Grandmother    COPD Maternal Grandmother    Hypertension Paternal Grandmother    Hypertension Paternal Grandfather     Social History   Socioeconomic History   Marital status: Single    Spouse name: Not on file   Number of children: Not on file   Years of education: Not on file    Highest education level: Some college, no degree  Occupational History   Not on file  Tobacco Use   Smoking status: Never   Smokeless tobacco: Never  Vaping Use   Vaping Use: Never used  Substance and Sexual Activity   Alcohol use: Yes   Drug use: Yes    Types: Marijuana   Sexual activity: Yes    Birth control/protection: Condom  Other Topics Concern   Not on file  Social History Narrative   Not on file   Social Determinants of Health   Financial Resource Strain: Not on file  Food Insecurity: Not on file  Transportation Needs: Not on file  Physical Activity: Not on file  Stress: Not on file  Social Connections: Not on file  Intimate Partner Violence: Not on file    Outpatient Medications Prior to Visit  Medication Sig Dispense Refill   albuterol (PROVENTIL HFA;VENTOLIN HFA) 108 (90 BASE) MCG/ACT inhaler Inhale 2 puffs into the lungs every 6 (six) hours as needed for wheezing.     EPINEPHrine 0.3 mg/0.3 mL IJ SOAJ injection Inject 0.3 mLs (0.3 mg total) into the muscle as needed for anaphylaxis. 1 each 1   metroNIDAZOLE (FLAGYL) 500 MG tablet Take 1 tablet (500 mg total) by mouth 2 (two) times daily. 14 tablet 0   modafinil (PROVIGIL) 200 MG tablet Take 1 tablet (200 mg total) by mouth daily. 30 tablet 1   Ruxolitinib Phosphate (OPZELURA) 1.5 % CREA Apply 1  application topically 2 (two) times daily. Not safe to use on pregnant patients 60 g 2   No facility-administered medications prior to visit.    Allergies  Allergen Reactions   Fish Allergy Anaphylaxis   Peanut-Containing Drug Products Anaphylaxis   Shellfish Allergy Anaphylaxis   Egg [Eggs Or Egg-Derived Products] Nausea And Vomiting   Other Cough    Ginger and barley   Latex Rash    Review of Systems  Constitutional:  Positive for chills. Negative for fever.  HENT:  Positive for congestion, postnasal drip, rhinorrhea and sore throat. Negative for ear discharge, ear pain, sinus pressure and sinus pain.    Respiratory:  Positive for cough. Negative for shortness of breath and wheezing.   Cardiovascular:  Negative for chest pain.  Gastrointestinal:  Negative for abdominal pain, diarrhea, nausea and vomiting.  Genitourinary:  Negative for dysuria, flank pain, frequency, hematuria and urgency.  Neurological:  Positive for headaches. Negative for dizziness.      Objective:    Physical Exam Constitutional:      Appearance: She is well-developed.  HENT:     Head: Normocephalic and atraumatic.     Right Ear: Tympanic membrane and ear canal normal.     Left Ear: Tympanic membrane and ear canal normal.     Nose: Congestion present.     Mouth/Throat:     Mouth: Mucous membranes are moist.     Pharynx: Oropharynx is clear.  Eyes:     Conjunctiva/sclera: Conjunctivae normal.  Cardiovascular:     Rate and Rhythm: Normal rate and regular rhythm.  Pulmonary:     Effort: Pulmonary effort is normal.     Breath sounds: Normal breath sounds.  Skin:    General: Skin is warm and dry.  Neurological:     General: No focal deficit present.     Mental Status: She is alert.  Psychiatric:        Mood and Affect: Mood normal.        Behavior: Behavior normal.    BP 112/66    Pulse (!) 108    Temp 98.1 F (36.7 C) (Oral)    Resp 16    Ht 5\' 1"  (1.549 m)    Wt 121 lb 9.6 oz (55.2 kg)    LMP 07/21/2021 (Exact Date)    SpO2 97%    BMI 22.98 kg/m  Wt Readings from Last 3 Encounters:  08/04/21 121 lb 9.6 oz (55.2 kg)  07/07/21 127 lb (57.6 kg)  06/01/21 127 lb (57.6 kg)    Health Maintenance Due  Topic Date Due   COVID-19 Vaccine (1) Never done   HPV VACCINES (1 - 2-dose series) Never done   TETANUS/TDAP  Never done       Topic Date Due   HPV VACCINES (1 - 2-dose series) Never done     Lab Results  Component Value Date   TSH 1.020 07/12/2020   Lab Results  Component Value Date   WBC 5.9 10/15/2019   HGB 12.9 10/15/2019   HCT 41.2 10/15/2019   MCV 92.0 10/15/2019   PLT 210  10/15/2019   Lab Results  Component Value Date   NA 140 10/15/2019   K 3.6 10/15/2019   CO2 24 10/15/2019   GLUCOSE 87 10/15/2019   BUN 7 10/15/2019   CREATININE 0.67 10/15/2019   CALCIUM 9.2 10/15/2019   ANIONGAP 13 10/15/2019   No results found for: CHOL No results found for: HDL No results found for: [facility]  No results found for: TRIG No results found for: CHOLHDL No results found for: HGBA1C     Assessment & Plan:   1. Upper respiratory tract infection, unspecified type/Acute cough: Discussed Flonase but she says she has frequent nose bleeds. Also discussed a medrol dosepack but she reacted poorly to steroids in the past. Will treat with cough suppressant today and other conservative measures. Follow up as needed or if symptoms worsen or fail to improve.  - benzonatate (TESSALON) 100 MG capsule; Take 1 capsule (100 mg total) by mouth 2 (two) times daily as needed for cough.  Dispense: 20 capsule; Refill: 0  2. Abnormal urine odor: UA in the office negative for signs of infection, continue to drink plenty of fluids.   - POCT Urinalysis Dipstick   Teodora Medici, DO

## 2021-08-08 ENCOUNTER — Encounter: Payer: Self-pay | Admitting: Internal Medicine

## 2021-08-15 ENCOUNTER — Ambulatory Visit: Payer: Managed Care, Other (non HMO) | Admitting: Pulmonary Disease

## 2021-08-19 IMAGING — CT CT MAXILLOFACIAL W/O CM
3 series · 15 of 47 positions shown, 18 images · non-contrast
Comparison: None.

CLINICAL DATA: headache and bilateral eye pain for 4 days.

EXAM:
CT HEAD WITHOUT CONTRAST
CT MAXILLOFACIAL WITHOUT CONTRAST
TECHNIQUE: Multidetector CT imaging of the head and maxillofacial structures
were performed using the standard protocol without intravenous
contrast. Multiplanar CT image reconstructions of the maxillofacial
structures were also generated.

[Series 1: max soft · axial · 0.29mm/px · z∈[+1404,+1530]mm · 9 of 73 slices shown, 12 images]
[im 5/73  brain]
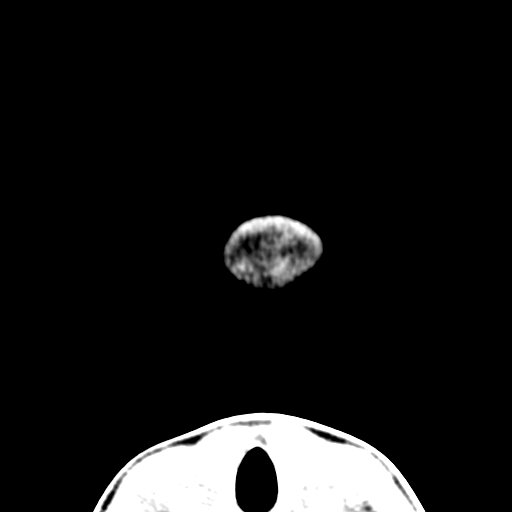
[im 5/73  bone]
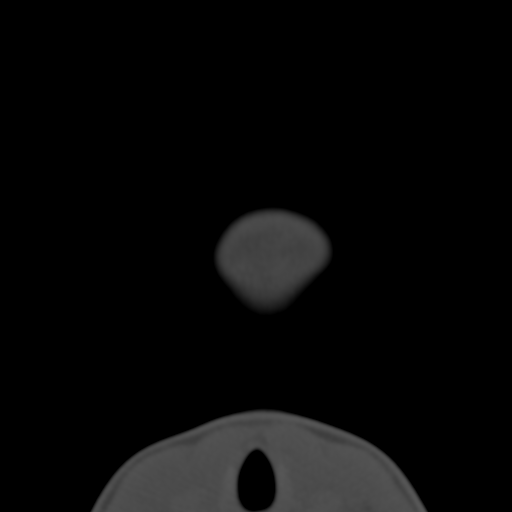
[im 13/73  bone]
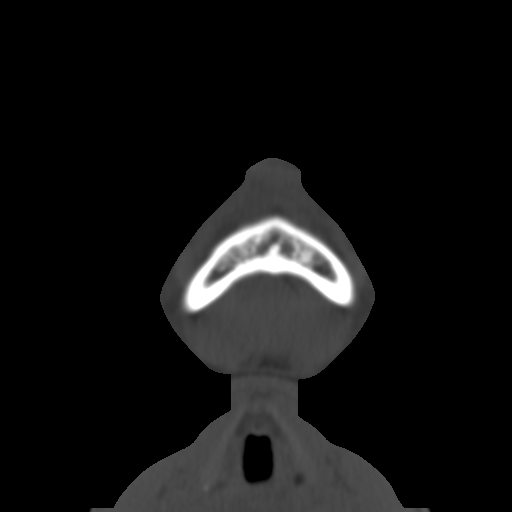
[im 20/73  bone]
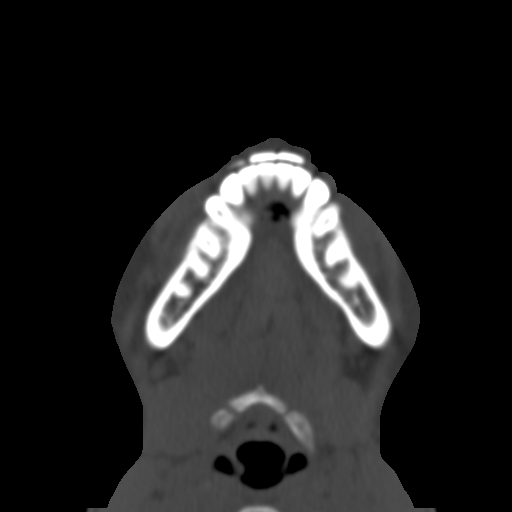
[im 28/73  bone]
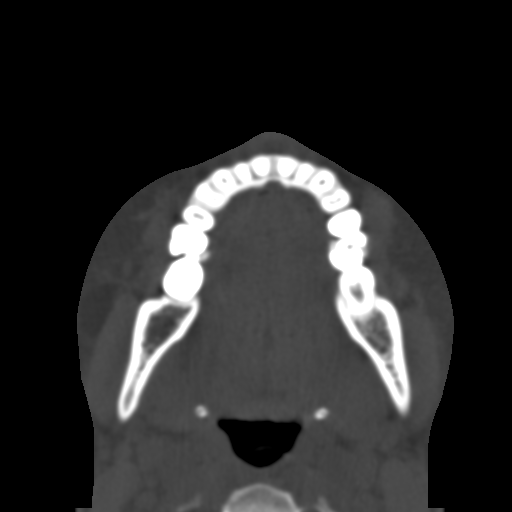
[im 38/73  brain]
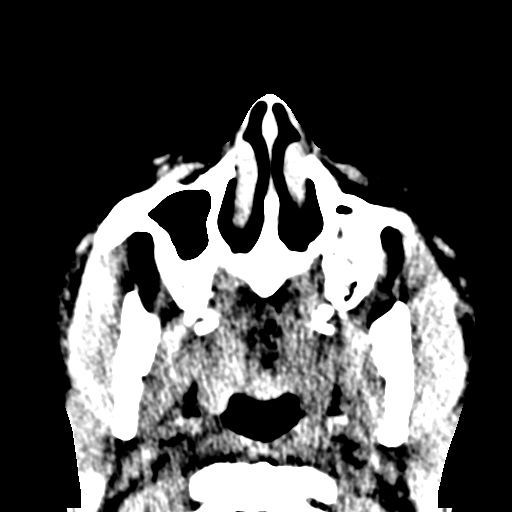
[im 38/73  bone]
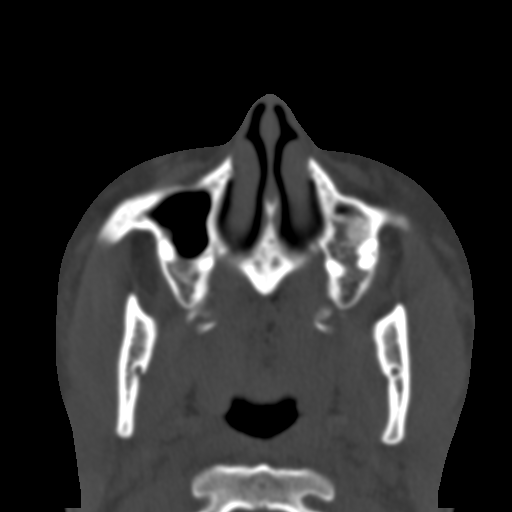
[im 45/73  bone]
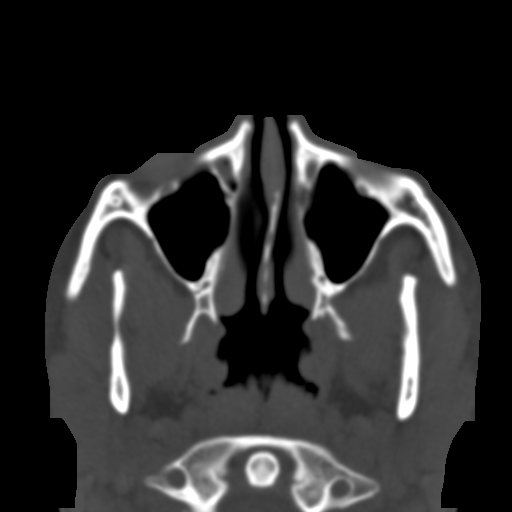
[im 53/73  bone]
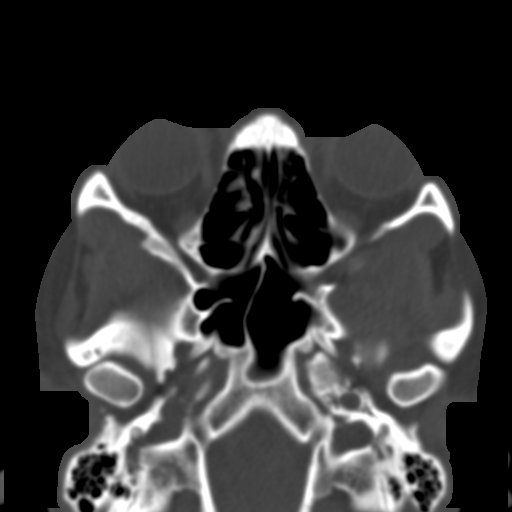
[im 60/73  bone]
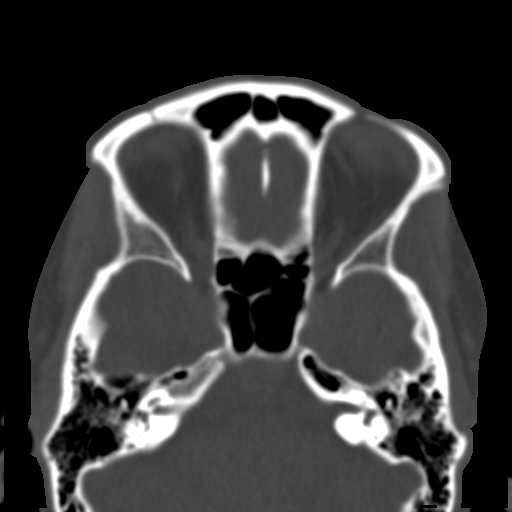
[im 68/73  brain]
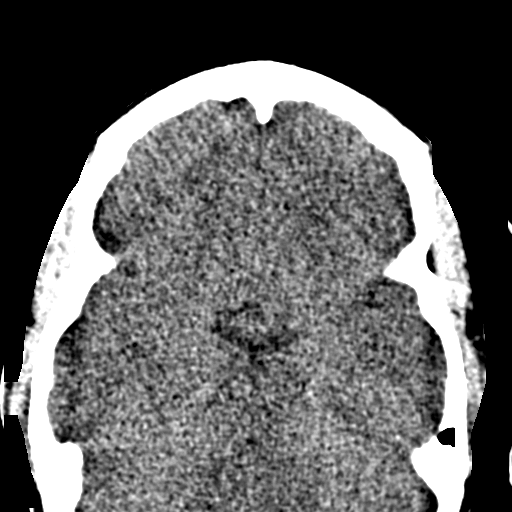
[im 68/73  bone]
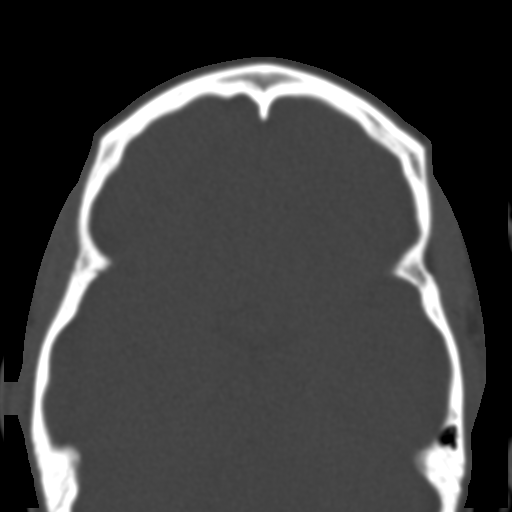

[Series 7: coronal soft · coronal · 0.28mm/px · 3 of 62 slices shown]
[im 28/62  bone]
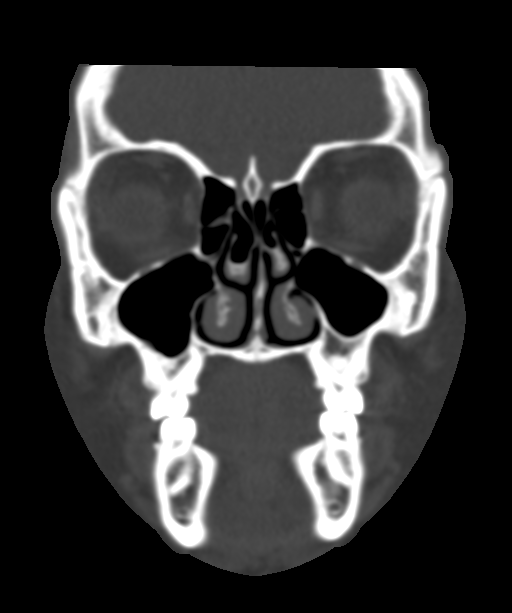
[im 34/62  bone]
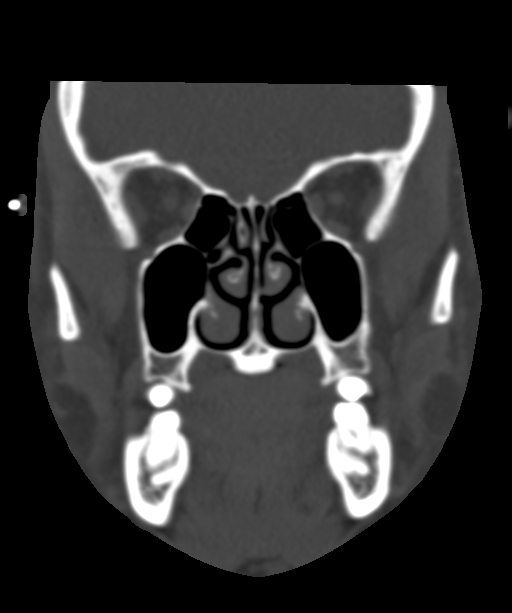
[im 41/62  bone]
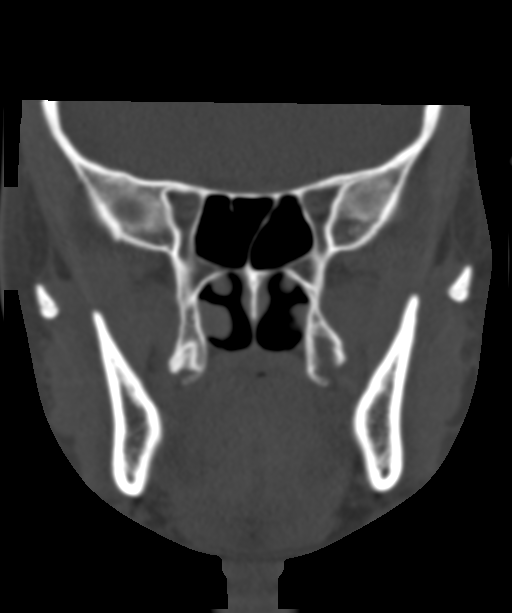

[Series 8: sagittal soft · sagittal · 0.24mm/px · 3 of 72 slices shown]
[im 24/72  bone]
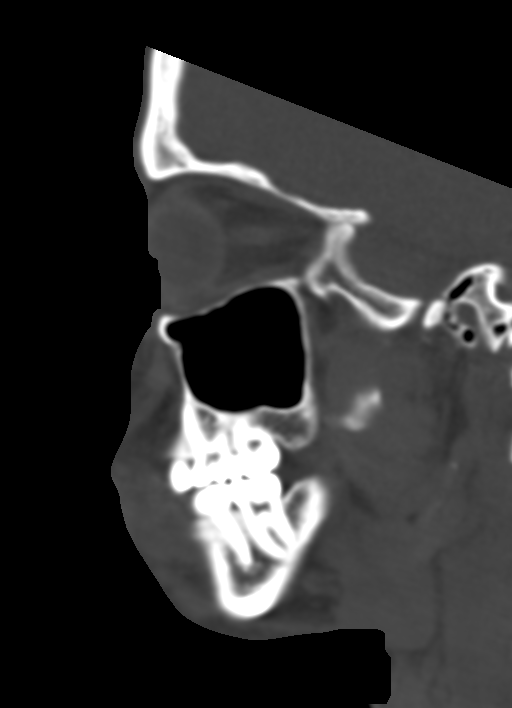
[im 36/72  bone]
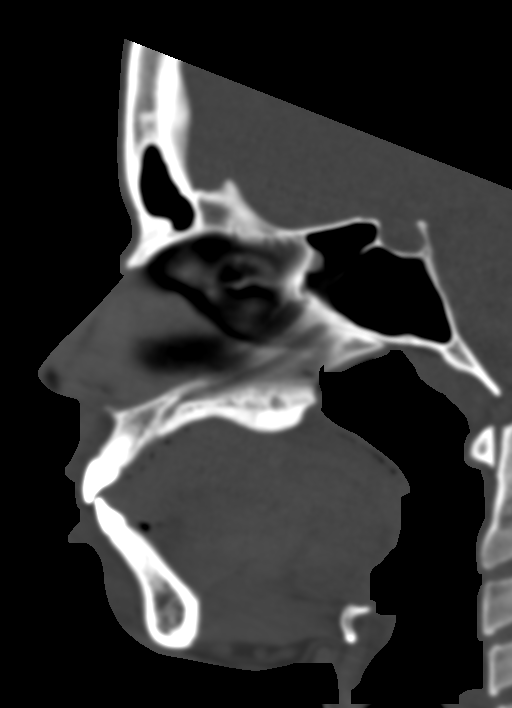
[im 48/72  bone]
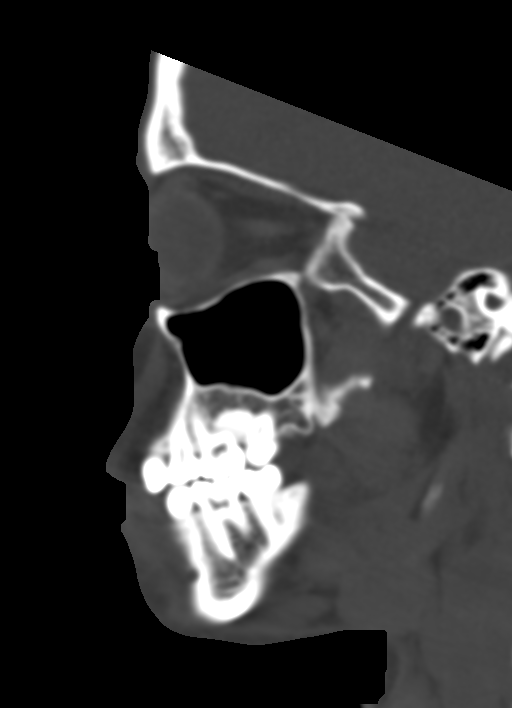

[15 of 47 positions shown; findings below may reference images not displayed]

FINDINGS: CT HEAD FINDINGS

Brain: There is no mass, hemorrhage or extra-axial collection. The
size and configuration of the ventricles and extra-axial CSF spaces
are normal. The brain parenchyma is normal, without evidence of
acute or chronic infarction.

Vascular: No hyperdense vessel or unexpected vascular calcification.

Skull: The visualized skull base, calvarium and extracranial soft
tissues are normal.

CT MAXILLOFACIAL FINDINGS

Osseous:

--Complex facial fracture types: No LeFort, zygomaticomaxillary
complex or nasoorbitoethmoidal fracture.

--Simple fracture types: None.

--Mandible, hard palate and teeth: No acute abnormality.

Orbits: The globes and optic nerves are intact. Normal extraocular
muscles and intraorbital fat.

Sinuses: No acute finding.

Soft tissues: Normal visualized extracranial soft tissues.
IMPRESSION: 1. Normal head CT.
2. Normal orbits.

## 2021-09-01 DIAGNOSIS — L816 Other disorders of diminished melanin formation: Secondary | ICD-10-CM | POA: Diagnosis not present

## 2021-09-01 DIAGNOSIS — L2089 Other atopic dermatitis: Secondary | ICD-10-CM | POA: Diagnosis not present

## 2021-09-15 ENCOUNTER — Other Ambulatory Visit: Payer: Self-pay

## 2021-09-15 ENCOUNTER — Encounter: Payer: Self-pay | Admitting: Pulmonary Disease

## 2021-09-15 ENCOUNTER — Ambulatory Visit (INDEPENDENT_AMBULATORY_CARE_PROVIDER_SITE_OTHER): Payer: Self-pay | Admitting: Pulmonary Disease

## 2021-09-15 DIAGNOSIS — G47419 Narcolepsy without cataplexy: Secondary | ICD-10-CM

## 2021-09-15 NOTE — Patient Instructions (Signed)
? ?  X we discussed sleep schedule ?Bedtime 10p ---> wake up 7A  ? ?NAp 12N/ 1 p  x max 2 hrs with alarm ?

## 2021-09-15 NOTE — Assessment & Plan Note (Signed)
we discussed importance of sleep schedule ?Bedtime 10p ---> wake up 7A  ? ?NAp 12N/ 1 p  x max 2 hrs with alarm ? ?We discussed frequent naps schedule, okay to take Provigil only on an as-needed basis depending on work or driving ?She does not have any symptoms of cataplexy ?

## 2021-09-15 NOTE — Progress Notes (Signed)
? ?  Subjective:  ? ? Patient ID: Heather Schmidt, female    DOB: 06-Mar-1996, 26 y.o.   MRN: [id number] ? ?HPI ? 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 ?Chief Complaint  ?Patient presents with  ? Follow-up  ?  Follow up for 1 month. Pt states she feels like she is not as tired, but she does sleep longer periods of time. She does wake up some in the middle of the night.   ? ?On her initial office visit, we started her on Provigil 200 mg. ?We discussed the importance of having a sleep and nap schedule ?We will give her a letter for work ?She took it for a few days and it does seem to work. 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 ?I reviewed sleep diary that she brought.  She sleeps late in the daytime and wakes up around noon to 1 PM. ?Bedtime is around 10 PM, she wakes up around 3 AM and then has difficulty falling asleep again but does fall asleep by 5 to 6 AM until she wakes up at 1 PM ?Weekends are late nights and bedtime can be as late as 3 to 4 AM ? ?There is no history suggestive of cataplexy, sleep paralysis or parasomnias ? ? ? ? ?Significant tests/ events reviewed ? ?03/2021 NPSG AHI 1/hour, TST 418 minutes, sleep latency 2 minutes, REM latency 94 minutes ?  ?03/2021 MSLT mean sleep latency 1.2 minutes, 3/5 SOR EMS on naps 3/4/5 ? ?Review of Systems ?neg for any significant sore throat, dysphagia, itching, sneezing, nasal congestion or excess/ purulent secretions, fever, chills, sweats, unintended wt loss, pleuritic or exertional cp, hempoptysis, orthopnea pnd or change in chronic leg swelling. Also denies presyncope, palpitations, heartburn, abdominal pain, nausea, vomiting, diarrhea or change in bowel or urinary habits, dysuria,hematuria, rash, arthralgias, visual complaints, headache, numbness weakness or ataxia. ? ?   ?Objective:  ? Physical Exam ? ?Gen. Pleasant,  well-nourished, in no distress ?ENT - no thrush, no pallor/icterus,no post nasal drip ?Neck: No JVD, no thyromegaly, no carotid bruits ?Lungs: no use of accessory muscles, no dullness to percussion, clear without rales or rhonchi  ?Cardiovascular: Rhythm regular, heart sounds  normal, no murmurs or gallops, no peripheral edema ?Musculoskeletal: No deformities, no cyanosis or clubbing  ? ? ? ?   ?Assessment & Plan:  ? ? ?

## 2021-11-23 ENCOUNTER — Ambulatory Visit (INDEPENDENT_AMBULATORY_CARE_PROVIDER_SITE_OTHER): Payer: BC Managed Care – PPO | Admitting: Family Medicine

## 2021-11-23 ENCOUNTER — Other Ambulatory Visit (HOSPITAL_COMMUNITY)
Admission: RE | Admit: 2021-11-23 | Discharge: 2021-11-23 | Disposition: A | Discharge status: Home / Self Care | Payer: BC Managed Care – PPO | Source: Ambulatory Visit | Attending: Family Medicine | Admitting: Family Medicine

## 2021-11-23 ENCOUNTER — Encounter: Payer: Self-pay | Admitting: Family Medicine

## 2021-11-23 VITALS — BP 101/67 | HR 61 | Ht 62.0 in | Wt 127.4 lb

## 2021-11-23 DIAGNOSIS — Z319 Encounter for procreative management, unspecified: Secondary | ICD-10-CM | POA: Diagnosis not present

## 2021-11-23 DIAGNOSIS — N898 Other specified noninflammatory disorders of vagina: Secondary | ICD-10-CM

## 2021-11-23 DIAGNOSIS — Z113 Encounter for screening for infections with a predominantly sexual mode of transmission: Secondary | ICD-10-CM

## 2021-11-23 DIAGNOSIS — N941 Unspecified dyspareunia: Secondary | ICD-10-CM | POA: Diagnosis not present

## 2021-11-23 DIAGNOSIS — K5901 Slow transit constipation: Secondary | ICD-10-CM | POA: Diagnosis not present

## 2021-11-23 DIAGNOSIS — Z01419 Encounter for gynecological examination (general) (routine) without abnormal findings: Secondary | ICD-10-CM | POA: Diagnosis not present

## 2021-11-23 MED ORDER — DOCUSATE SODIUM 100 MG PO CAPS: 100.0000 mg | ORAL_CAPSULE | Freq: Two times a day (BID) | ORAL | 2 refills | Status: DC | PRN

## 2021-11-23 NOTE — Patient Instructions (Signed)

## 2021-11-23 NOTE — Progress Notes (Signed)
Subjective:     Heather Schmidt is a 26 y.o. female and is here for a comprehensive physical exam. The patient reports problems - cycles have gotten a little more irregular . Trying to have a baby x 1 year. Does have h/o chlamydia in the past. No PID. Reports painful intercourse bilaterally. Reports vaginal odor-- Reports constipation. Tried Miralax in the past, which gives her opposite effects.  The following portions of the patient's history were reviewed and updated as appropriate: allergies, current medications, past family history, past medical history, past social history, past surgical history, and problem list.  Review of Systems Pertinent items noted in HPI and remainder of comprehensive ROS otherwise negative.   Objective:    BP 101/67 (BP Location: Left Arm, Patient Position: Sitting, Cuff Size: Normal)   Pulse 61   Ht 5\' 2"  (1.575 m)   Wt 127 lb 6.4 oz (57.8 kg)   LMP 11/08/2021 (Exact Date)   BMI 23.30 kg/m  General appearance: alert, cooperative, and appears stated age Head: Normocephalic, without obvious abnormality, atraumatic Neck: no adenopathy, supple, symmetrical, trachea midline, and thyroid not enlarged, symmetric, no tenderness/mass/nodules Lungs: clear to auscultation bilaterally Heart: regular rate and rhythm, S1, S2 normal, no murmur, click, rub or gallop Abdomen: soft, non-tender; bowel sounds normal; no masses,  no organomegaly Extremities: extremities normal, atraumatic, no cyanosis or edema Pulses: 2+ and symmetric Skin: Skin color, texture, turgor normal. No rashes or lesions Lymph nodes: Cervical, supraclavicular, and axillary nodes normal. Neurologic: Grossly normal    Assessment:    Healthy female exam.      Plan:  Screening for STDs (sexually transmitted diseases) 01/08/2022 - Plan: Cervicovaginal ancillary only  Vaginal odor - - 81856 - check wet prep and treat acccordingly - Plan: Cervicovaginal ancillary only  Encounter for  gynecological examination without abnormal finding 31497 - last pap was WNL  Slow transit constipation - 99213 - trial of Colace - Plan: docusate sodium (COLACE) 100 MG capsule  Infertility management - 02637 - discussed usual fecundity rate-HSG, labs, Ovulation prediction, semen anaylysis. Opts to await w/u.   Return in 1 year (on 11/24/2022).  See After Visit Summary for Counseling Recommendations

## 2021-11-24 LAB — CERVICOVAGINAL ANCILLARY ONLY
Bacterial Vaginitis (gardnerella): NEGATIVE
Candida Glabrata: NEGATIVE
Candida Vaginitis: NEGATIVE
Chlamydia: NEGATIVE
Comment: NEGATIVE
Comment: NEGATIVE
Comment: NEGATIVE
Comment: NEGATIVE
Comment: NEGATIVE
Comment: NORMAL
Neisseria Gonorrhea: NEGATIVE
Trichomonas: NEGATIVE

## 2022-01-12 ENCOUNTER — Encounter: Payer: Self-pay | Admitting: Family Medicine

## 2022-01-18 ENCOUNTER — Encounter: Payer: Self-pay | Admitting: Obstetrics and Gynecology

## 2022-01-18 ENCOUNTER — Ambulatory Visit (INDEPENDENT_AMBULATORY_CARE_PROVIDER_SITE_OTHER): Payer: BC Managed Care – PPO | Admitting: Obstetrics and Gynecology

## 2022-01-18 VITALS — BP 104/67 | HR 61 | Ht 62.0 in | Wt 131.8 lb

## 2022-01-18 DIAGNOSIS — N941 Unspecified dyspareunia: Secondary | ICD-10-CM

## 2022-01-18 NOTE — Progress Notes (Signed)
  Obstetrics and Gynecology Visit Return Patient Evaluation  Appointment Date: 01/18/2022  Primary Care Provider: Margarita Schmidt  OBGYN Clinic: [facility]  Chief Complaint: dyspareunia  History of Present Illness:  Heather Schmidt is a 26 y.o. P0 (LMP: 5 weeks ago) with above CC. Last occurred two days ago, she thinks with deeper penetration, doesn't happen each time maybe 1-2x/month, has been going on for about a year, no new partner. Pap UTD and she had swab testing in may that was negative. She said she was told to call us if it happened again for work up.   Review of Systems:  as noted in the History of Present Illness.  Medications:  Heather Schmidt. Heather Schmidt had no medications administered during this visit. Current Outpatient Medications  Medication Sig Dispense Refill   albuterol (PROVENTIL HFA;VENTOLIN HFA) 108 (90 BASE) MCG/ACT inhaler Inhale 2 puffs into the lungs every 6 (six) hours as needed for wheezing.     modafinil (PROVIGIL) 200 MG tablet Take 1 tablet (200 mg total) by mouth daily. 30 tablet 1   docusate sodium (COLACE) 100 MG capsule Take 1 capsule (100 mg total) by mouth 2 (two) times daily as needed. (Patient not taking: Reported on 01/18/2022) 180 capsule 2   EPINEPHrine 0.3 mg/0.3 mL IJ SOAJ injection Inject 0.3 mLs (0.3 mg total) into the muscle as needed for anaphylaxis. (Patient not taking: Reported on 11/23/2021) 1 each 1   Ruxolitinib Phosphate (OPZELURA) 1.5 % CREA Apply 1 application topically 2 (two) times daily. Not safe to use on pregnant patients (Patient not taking: Reported on 09/15/2021) 60 g 2   No current facility-administered medications for this visit.    Allergies: is allergic to fish allergy, peanut-containing drug products, shellfish allergy, egg [eggs or egg-derived products], other, and latex.  Physical Exam:  BP 104/67   Pulse 61   Ht 5\' 2"  (1.575 m)   Wt 131 lb 12.8 oz (59.8 kg)   LMP 12/12/2021 (Exact  Date)   BMI 24.11 kg/m  Body mass index is 24.11 kg/m. General appearance: Well nourished, well developed female in no acute distress.  Abdomen: diffusely non tender to palpation, non distended, and no masses, hernias Neuro/Psych:  Normal mood and affect.    Pelvic exam:  EGBUS: normal Vaginal vault: normal Cervix:  normal Bimanual: normal. She states that I replicated the discomfort near the llq with my abdominal hand  Labs: upt neg  Assessment: pt stable  Plan:  1. Dyspareunia, female She uses a period tracker and I asked her to track when it happens as it could be related to discomfort from having a functional cyst in the latter part of her cycle. Pt okay with holding off on u/s and see when it happens in the future. Pt told she can call u/s for an u/s if this keeps going on.  Pt told to check UPT or call 02/11/2022 for one in a week if still no period. She is not currently/interested using anything for birth control    RTC: PRN  [place] MD Attending [facility] for [facility])

## 2022-02-02 ENCOUNTER — Encounter: Payer: Self-pay | Admitting: Obstetrics and Gynecology

## 2022-02-03 ENCOUNTER — Other Ambulatory Visit: Payer: Self-pay | Admitting: Obstetrics and Gynecology

## 2022-02-03 ENCOUNTER — Telehealth: Payer: Self-pay

## 2022-02-03 DIAGNOSIS — N941 Unspecified dyspareunia: Secondary | ICD-10-CM

## 2022-02-03 NOTE — Telephone Encounter (Signed)
Notified pt of ultrasound and follow up appt. Pt voiced understanding

## 2022-02-17 ENCOUNTER — Ambulatory Visit: Payer: BC Managed Care – PPO

## 2022-02-21 ENCOUNTER — Ambulatory Visit: Payer: BC Managed Care – PPO | Admitting: Obstetrics and Gynecology

## 2022-02-24 ENCOUNTER — Other Ambulatory Visit: Payer: BC Managed Care – PPO

## 2022-03-07 ENCOUNTER — Ambulatory Visit
Admission: RE | Admit: 2022-03-07 | Discharge: 2022-03-07 | Disposition: A | Discharge status: Home / Self Care | Payer: BC Managed Care – PPO | Source: Ambulatory Visit | Attending: Obstetrics and Gynecology | Admitting: Obstetrics and Gynecology

## 2022-03-07 DIAGNOSIS — N941 Unspecified dyspareunia: Secondary | ICD-10-CM | POA: Insufficient documentation

## 2022-03-07 DIAGNOSIS — N854 Malposition of uterus: Secondary | ICD-10-CM | POA: Diagnosis not present

## 2022-04-04 ENCOUNTER — Ambulatory Visit: Payer: BC Managed Care – PPO | Admitting: Obstetrics and Gynecology

## 2022-04-10 ENCOUNTER — Encounter: Payer: Self-pay | Admitting: Pulmonary Disease

## 2022-04-10 ENCOUNTER — Encounter: Payer: Self-pay | Admitting: *Deleted

## 2022-04-10 NOTE — Telephone Encounter (Signed)
Mychart message sent by pt: Heather Schmidt  P [facility] (supporting Rigoberto Noel, MD) 12 minutes ago (11:29 AM)    Good morning,    I recently started a new job. I informed HR about my disability and my accommodations. I was told that I need a doctors note. Is there any way that you can send me one to my email or through here?     Dr. Elsworth Soho, please advise on this for pt. If you are fine with [place] doing a doctor's note, please advise what needs to be stated in it.

## 2022-04-19 ENCOUNTER — Encounter: Payer: Self-pay | Admitting: Family Medicine

## 2022-04-19 ENCOUNTER — Ambulatory Visit (INDEPENDENT_AMBULATORY_CARE_PROVIDER_SITE_OTHER): Payer: BC Managed Care – PPO | Admitting: Family Medicine

## 2022-04-19 VITALS — BP 110/72 | HR 80

## 2022-04-19 DIAGNOSIS — Z319 Encounter for procreative management, unspecified: Secondary | ICD-10-CM | POA: Diagnosis not present

## 2022-04-19 DIAGNOSIS — G47419 Narcolepsy without cataplexy: Secondary | ICD-10-CM

## 2022-04-19 DIAGNOSIS — B009 Herpesviral infection, unspecified: Secondary | ICD-10-CM

## 2022-04-19 MED ORDER — VALACYCLOVIR HCL 1 G PO TABS: 1000.0000 mg | ORAL_TABLET | Freq: Two times a day (BID) | ORAL | 2 refills | Status: DC

## 2022-04-19 NOTE — Assessment & Plan Note (Signed)
Check HSG--if normal, next step might be semen analysis--consider blood work--and referral REI--has dyspareunia, so endometriosis, may be a concern

## 2022-04-19 NOTE — Assessment & Plan Note (Signed)
Check HSV culture--will give Valtrex for treatment and suppression.

## 2022-04-19 NOTE — Progress Notes (Signed)
   Subjective:    Patient ID: Heather Schmidt is a 26 y.o. female presenting with Mass  on 04/19/2022  HPI: Here today with c/o vulvar lump. Gets one about q 1 year. Not really painful. Reports HSV outbreak on her arm from time to time. Also, still quite tired and falling asleep. She is seeing pulmonary, wants referral to neuro--unable to drive with dx. Reports pelvic pain, comes and goes. Pain with intercourse. No diary. See note from Dr. Ilda Basset on 01/18/22. Reports infertility. See prior note about hx 11/23/21. Regular cycles. H/o Chlamydia, but no PID.  Review of Systems  Constitutional:  Negative for chills and fever.  Respiratory:  Negative for shortness of breath.   Cardiovascular:  Negative for chest pain.  Gastrointestinal:  Negative for abdominal pain, nausea and vomiting.  Genitourinary:  Positive for pelvic pain. Negative for dysuria.  Skin:  Negative for rash.      Objective:    BP 110/72   Pulse 80  Physical Exam Exam conducted with a chaperone present.  Constitutional:      General: She is not in acute distress.    Appearance: She is well-developed.  HENT:     Head: Normocephalic and atraumatic.  Eyes:     General: No scleral icterus. Cardiovascular:     Rate and Rhythm: Normal rate.  Pulmonary:     Effort: Pulmonary effort is normal.  Abdominal:     Palpations: Abdomen is soft.  Genitourinary:    Comments: 3 small purulent vesicles noted on right labia majora. Musculoskeletal:     Cervical back: Neck supple.  Skin:    General: Skin is warm and dry.  Neurological:     Mental Status: She is alert and oriented to person, place, and time.         Assessment & Plan:   Problem List Items Addressed This Visit       Unprioritized   Narcolepsy - Primary    Will refer to Neurology      Relevant Orders   Ambulatory referral to Neurology   Infertility management    Check HSG--if normal, next step might be semen analysis--consider blood  work--and referral REI--has dyspareunia, so endometriosis, may be a concern      Relevant Orders   DG Hysterogram (HSG)   HSV (herpes simplex virus) infection    Check HSV culture--will give Valtrex for treatment and suppression.      Relevant Medications   valACYclovir (VALTREX) 1000 MG tablet   Other Relevant Orders   Herpes simplex virus culture    Return in about 2 months (around 06/19/2022).  Donnamae Jude, MD 04/19/2022 2:22 PM

## 2022-04-19 NOTE — Assessment & Plan Note (Signed)
Will refer to Neurology ?

## 2022-04-19 NOTE — Progress Notes (Signed)
Lump on right labia x 2 days, it has been awhile since it appeared

## 2022-04-20 ENCOUNTER — Encounter: Payer: Self-pay | Admitting: Internal Medicine

## 2022-04-20 ENCOUNTER — Other Ambulatory Visit: Payer: Self-pay

## 2022-04-20 ENCOUNTER — Ambulatory Visit (INDEPENDENT_AMBULATORY_CARE_PROVIDER_SITE_OTHER): Payer: BC Managed Care – PPO | Admitting: Internal Medicine

## 2022-04-20 VITALS — BP 118/72 | HR 84 | Temp 98.8°F | Resp 18 | Ht 61.0 in | Wt 133.5 lb

## 2022-04-20 DIAGNOSIS — R202 Paresthesia of skin: Secondary | ICD-10-CM

## 2022-04-20 NOTE — Progress Notes (Signed)
   Acute Office Visit  Subjective:     Patient ID: Heather Schmidt, female    DOB: May 10, 1996, 26 y.o.   MRN: [id number]  Chief Complaint  Patient presents with   Hand Injury    Fingers  numb for 3 days    HPI Patient is in today for numbness. States she was carrying a bag with tablecloths in it on the tip of her right middle finger when she fell after stepping on a balloon and hitting her right forearm on the ground. Numb with tingling feeling, slightly improving over time.   NUMBNESS Duration: 3 days Onset: sudden Location: Tip of the right third digit Bilateral: no Symmetric: no Decreased sensation: yes  Weakness: no Pain: no Quality:  Like finger is falling asleep Severity: mild  Frequency: constant Trauma: yes Recent illness: no Alleviating factors: Nothing Aggravating factors: wrist flexion Status: better  Review of Systems  Constitutional:  Negative for chills and fever.  Neurological:  Positive for tingling and sensory change. Negative for weakness.      Objective:    BP 118/72   Pulse 84   Temp 98.8 F (37.1 C) (Oral)   Resp 18   Ht 5\' 1"  (1.549 m)   Wt 133 lb 8 oz (60.6 kg)   SpO2 99%   BMI 25.22 kg/m  BP Readings from Last 3 Encounters:  04/20/22 118/72  04/19/22 110/72  01/18/22 104/67   Wt Readings from Last 3 Encounters:  04/20/22 133 lb 8 oz (60.6 kg)  01/18/22 131 lb 12.8 oz (59.8 kg)  11/23/21 127 lb 6.4 oz (57.8 kg)      Physical Exam Constitutional:      Appearance: Normal appearance.  HENT:     Head: Normocephalic and atraumatic.  Eyes:     Conjunctiva/sclera: Conjunctivae normal.  Cardiovascular:     Rate and Rhythm: Normal rate and regular rhythm.  Pulmonary:     Effort: Pulmonary effort is normal.     Breath sounds: Normal breath sounds.  Musculoskeletal:        General: No swelling or tenderness.     Comments: Good grip strength. Good range of motion of the wrist and elbow with radial head hypermobility on the  right. Phalen's and reverse Phalen's negative. Tinel's negative.   Skin:    General: Skin is warm and dry.  Neurological:     General: No focal deficit present.     Mental Status: She is alert. Mental status is at baseline.     Sensory: Sensory deficit present.     Comments: Sensory change only at the tip of the third right digit and the lateral side but improving  Psychiatric:        Mood and Affect: Mood normal.        Behavior: Behavior normal.     No results found for any visits on 04/20/22.      Assessment & Plan:   1. Right hand paresthesia: Most likely due to recent trauma/fall. Sensation gradually returning. Discussed obtaining labs to rule out thyroid disease and vitamin deficiencies which patient politely declines unless symptoms get worse. Discussed night splint, wrist massage and gentle stretching. Will follow up if symptoms worsen or fail to improve.   Return if symptoms worsen or fail to improve.  Teodora Medici, DO

## 2022-04-21 LAB — HERPES SIMPLEX VIRUS CULTURE

## 2022-05-05 ENCOUNTER — Encounter: Payer: Self-pay | Admitting: Family Medicine

## 2022-05-16 ENCOUNTER — Encounter: Payer: Self-pay | Admitting: Internal Medicine

## 2022-05-16 ENCOUNTER — Ambulatory Visit (INDEPENDENT_AMBULATORY_CARE_PROVIDER_SITE_OTHER): Payer: BC Managed Care – PPO | Admitting: Internal Medicine

## 2022-05-16 VITALS — BP 118/64 | HR 85 | Temp 98.2°F | Resp 18 | Ht 61.0 in | Wt 134.3 lb

## 2022-05-16 DIAGNOSIS — J069 Acute upper respiratory infection, unspecified: Secondary | ICD-10-CM | POA: Diagnosis not present

## 2022-05-16 DIAGNOSIS — R051 Acute cough: Secondary | ICD-10-CM

## 2022-05-16 LAB — POCT INFLUENZA A/B
Influenza A, POC: NEGATIVE
Influenza B, POC: NEGATIVE

## 2022-05-16 MED ORDER — BENZONATATE 100 MG PO CAPS: 100.0000 mg | ORAL_CAPSULE | Freq: Two times a day (BID) | ORAL | 0 refills | Status: DC | PRN

## 2022-05-16 MED ORDER — METHYLPREDNISOLONE 4 MG PO TBPK: ORAL_TABLET | ORAL | 0 refills | Status: DC

## 2022-05-16 NOTE — Patient Instructions (Signed)
It was great seeing you today!  Plan discussed at today's visit: -Flu and COVID test today -Oral steroids and cough medication sent to pharmacy   Follow up in: as needed  Take care and let us know if you have any questions or concerns prior to your next visit.  Dr. Caralee Ates

## 2022-05-16 NOTE — Progress Notes (Signed)
Acute Office Visit  Subjective:     Patient ID: Heather Schmidt, female    DOB: [birth year], 26 y.o.   MRN: [id number]  Chief Complaint  Patient presents with   Cough    That is bloody    HPI Patient is in today for cough about 4 days ago.   URI Compliant:  -Worst symptom: cough -Fever: no -Cough: yes; slightly productive mostly dry  -Shortness of breath: yes -Wheezing: yes -Chest tightness: yes -Chest congestion: no -Nasal congestion: no -Runny nose: no -Post nasal drip: yes -Sore throat: no -Sinus pressure: no -Headache: yes -Face pain: no -Ear pain: no  -Ear pressure: no   -Vomiting: no -Sick contacts: no -Context: worse -Relief with OTC cold/cough medications: no  -Treatments attempted: Albuterol 4 times a day   Review of Systems  Constitutional:  Negative for chills and fever.  HENT:  Negative for congestion, ear pain, sinus pain and sore throat.   Respiratory:  Positive for cough, sputum production, shortness of breath and wheezing. Negative for hemoptysis.   Gastrointestinal:  Negative for abdominal pain, diarrhea, nausea and vomiting.  Neurological:  Positive for headaches.      Objective:    BP 118/64   Pulse 85   Temp 98.2 F (36.8 C)   Resp 18   Ht 5\' 1"  (1.549 m)   Wt 134 lb 4.8 oz (60.9 kg)   LMP 05/05/2022   SpO2 96%   BMI 25.38 kg/m    Physical Exam Constitutional:      Appearance: Normal appearance.  HENT:     Head: Normocephalic and atraumatic.     Right Ear: Tympanic membrane, ear canal and external ear normal.     Left Ear: Tympanic membrane, ear canal and external ear normal.     Nose: Nose normal.     Mouth/Throat:     Mouth: Mucous membranes are moist.     Comments: Mild PND Eyes:     Conjunctiva/sclera: Conjunctivae normal.  Cardiovascular:     Rate and Rhythm: Normal rate and regular rhythm.  Pulmonary:     Effort: Pulmonary effort is normal.     Breath sounds: No wheezing, rhonchi or rales.     Comments:  Decreased air movement throughout  Skin:    General: Skin is warm and dry.  Neurological:     General: No focal deficit present.     Mental Status: She is alert. Mental status is at baseline.  Psychiatric:        Mood and Affect: Mood normal.        Behavior: Behavior normal.     No results found for any visits on 05/16/22.      Assessment & Plan:   Viral upper respiratory tract infection/Acute cough: COVID and flu tested today. Treat with Medrol dosepack for respiratory symptoms, cough suppressant sent to pharmacy as well. Discussed Flonase, which patient says gives her nosebleeds. Recommend nasal saline and humidifier as well. Follow up if symptoms worsen or fail to improve.   - Novel Coronavirus, NAA ([facility]) - POCT Influenza A/B - methylPREDNISolone (MEDROL DOSEPAK) 4 MG TBPK tablet; Day 1: Take 8 mg (2 tablets) before breakfast, 4 mg (1 tablet) after lunch, 4 mg (1 tablet) after supper, and 8 mg (2 tablets) at bedtime. Day 2:Take 4 mg (1 tablet) before breakfast, 4 mg (1 tablet) after lunch, 4 mg (1 tablet) after supper, and 8 mg (2 tablets) at bedtime. Day 3: Take 4 mg (1 tablet) before  breakfast, 4 mg (1 tablet) after lunch, 4 mg (1 tablet) after supper, and 4 mg (1 tablet) at bedtime. Day 4: Take 4 mg (1 tablet) before breakfast, 4 mg (1 tablet) after lunch, and 4 mg (1 tablet) at bedtime. Day 5: Take 4 mg (1 tablet) before breakfast and 4 mg (1 tablet) at bedtime. Day 6: Take 4 mg (1 tablet) before breakfast.  Dispense: 1 each; Refill: 0 - benzonatate (TESSALON) 100 MG capsule; Take 1 capsule (100 mg total) by mouth 2 (two) times daily as needed for cough.  Dispense: 20 capsule; Refill: 0   Return if symptoms worsen or fail to improve.  [name], DO

## 2022-05-17 ENCOUNTER — Other Ambulatory Visit: Payer: Self-pay | Admitting: Internal Medicine

## 2022-05-17 DIAGNOSIS — J069 Acute upper respiratory infection, unspecified: Secondary | ICD-10-CM

## 2022-05-17 DIAGNOSIS — R051 Acute cough: Secondary | ICD-10-CM

## 2022-05-17 LAB — NOVEL CORONAVIRUS, NAA: SARS-CoV-2, NAA: NOT DETECTED

## 2022-05-17 MED ORDER — ALBUTEROL SULFATE HFA 108 (90 BASE) MCG/ACT IN AERS: 2.0000 | INHALATION_SPRAY | Freq: Four times a day (QID) | RESPIRATORY_TRACT | 1 refills | Status: DC | PRN

## 2022-05-24 ENCOUNTER — Ambulatory Visit
Admission: RE | Admit: 2022-05-24 | Discharge: 2022-05-24 | Disposition: A | Discharge status: Home / Self Care | Payer: BC Managed Care – PPO | Source: Ambulatory Visit | Attending: Internal Medicine | Admitting: Internal Medicine

## 2022-05-24 ENCOUNTER — Ambulatory Visit
Admission: RE | Admit: 2022-05-24 | Discharge: 2022-05-24 | Disposition: A | Discharge status: Home / Self Care | Payer: BC Managed Care – PPO | Attending: Internal Medicine | Admitting: Internal Medicine

## 2022-05-24 ENCOUNTER — Encounter: Payer: Self-pay | Admitting: Internal Medicine

## 2022-05-24 ENCOUNTER — Other Ambulatory Visit: Payer: Self-pay | Admitting: Internal Medicine

## 2022-05-24 DIAGNOSIS — R051 Acute cough: Secondary | ICD-10-CM | POA: Diagnosis not present

## 2022-05-24 DIAGNOSIS — R059 Cough, unspecified: Secondary | ICD-10-CM | POA: Diagnosis not present

## 2022-05-31 ENCOUNTER — Telehealth (HOSPITAL_BASED_OUTPATIENT_CLINIC_OR_DEPARTMENT_OTHER): Payer: Self-pay

## 2022-05-31 NOTE — Telephone Encounter (Signed)
Called spk to pt to schedule appt with alva she states she will need to call back

## 2022-06-14 ENCOUNTER — Telehealth (INDEPENDENT_AMBULATORY_CARE_PROVIDER_SITE_OTHER): Payer: BC Managed Care – PPO | Admitting: Family Medicine

## 2022-06-14 DIAGNOSIS — G47419 Narcolepsy without cataplexy: Secondary | ICD-10-CM

## 2022-06-14 DIAGNOSIS — Z319 Encounter for procreative management, unspecified: Secondary | ICD-10-CM | POA: Diagnosis not present

## 2022-06-14 NOTE — Progress Notes (Signed)
Virtual GYN F/U visit.  Patient states she was not able to schedule HSG.    Pt has no concerns today.

## 2022-06-14 NOTE — Progress Notes (Signed)
    GYNECOLOGY VIRTUAL VISIT ENCOUNTER NOTE  Provider location: Center for [facility]   Patient location: Home  I connected with Heather Schmidt on 06/14/22 at  3:10 PM EST by MyChart Video Encounter and verified that I am speaking with the correct person using two identifiers.   I discussed the limitations, risks, security and privacy concerns of performing an evaluation and management service virtually and the availability of in person appointments. I also discussed with the patient that there may be a patient responsible charge related to this service. The patient expressed understanding and agreed to proceed.   History:  Heather Schmidt is a 26 y.o. G0P0000 female being evaluated today for f/u infertility and neurology referral. She denies any abnormal vaginal discharge, bleeding, pelvic pain or other concerns.       Past Medical History:  Diagnosis Date   Asthma    Chlamydia 06/06/2019   COVID-19 06/23/2020   Depression    Narcolepsy    Past Surgical History:  Procedure Laterality Date   WISDOM TOOTH EXTRACTION     The following portions of the patient's history were reviewed and updated as appropriate: allergies, current medications, past family history, past medical history, past social history, past surgical history and problem list.   Health Maintenance:  Normal pap and negative HRHPV on 11/10/2020.    Review of Systems:  Pertinent items noted in HPI and remainder of comprehensive ROS otherwise negative.  Physical Exam:   General:  Alert, oriented and cooperative. Patient appears to be in no acute distress.  Mental Status: Normal mood and affect. Normal behavior. Normal judgment and thought content.   Respiratory: Normal respiratory effort, no problems with respiration noted  Rest of physical exam deferred due to type of encounter  Labs and Imaging No results found for this or any previous visit (from the past 336 hour(s)). DG  Chest 2 View  Result Date: 05/24/2022 CLINICAL DATA:  Persistent cough for 3 weeks EXAM: CHEST - 2 VIEW COMPARISON:  None Available. FINDINGS: The heart size and mediastinal contours are within normal limits. Both lungs are clear. The visualized skeletal structures are unremarkable. IMPRESSION: No active cardiopulmonary disease. Electronically Signed   By: Judie Petit.  Shick M.D.   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  2. Infertility management Needs HSG to be scheduled.       I discussed the assessment and treatment plan with the patient. The patient was provided an opportunity to ask questions and all were answered. The patient agreed with the plan and demonstrated an understanding of the instructions.   The patient was advised to call back or seek an in-person evaluation/go to the ED if the symptoms worsen or if the condition fails to improve as anticipated.  I provided 5 minutes of face-to-face time during this encounter.   Reva Bores, MD Center for [employer], [facility]

## 2022-07-27 ENCOUNTER — Encounter: Payer: Self-pay | Admitting: Dermatology

## 2022-07-27 ENCOUNTER — Ambulatory Visit (INDEPENDENT_AMBULATORY_CARE_PROVIDER_SITE_OTHER): Payer: BC Managed Care – PPO | Admitting: Dermatology

## 2022-07-27 VITALS — BP 112/73 | HR 87

## 2022-07-27 DIAGNOSIS — Z79899 Other long term (current) drug therapy: Secondary | ICD-10-CM

## 2022-07-27 DIAGNOSIS — K130 Diseases of lips: Secondary | ICD-10-CM

## 2022-07-27 DIAGNOSIS — B009 Herpesviral infection, unspecified: Secondary | ICD-10-CM

## 2022-07-27 DIAGNOSIS — L209 Atopic dermatitis, unspecified: Secondary | ICD-10-CM | POA: Diagnosis not present

## 2022-07-27 MED ORDER — VALACYCLOVIR HCL 500 MG PO TABS: ORAL_TABLET | ORAL | 11 refills | Status: DC

## 2022-07-27 NOTE — Progress Notes (Signed)
   Follow-Up Visit   Subjective  Heather Schmidt is a 27 y.o. female who presents for the following: Eczema (Patient here for follow up on eczema at arms and legs. Reports she was unable to get prescription for opzelura. She feels she is currently doing well with no treatment. ) and Other (Patient reports some spots around mouth she can not get rid of and would like to discuss. Breakouts on left arm she occasionally gets and believes are cold sores. ). The patient has spots, moles and lesions to be evaluated, some may be new or changing and the patient has concerns that these could be cancer.  The following portions of the chart were reviewed this encounter and updated as appropriate:  Tobacco  Allergies  Meds  Problems  Med Hx  Surg Hx  Fam Hx     Review of Systems: No other skin or systemic complaints except as noted in HPI or Assessment and Plan.  Objective  Well appearing patient in no apparent distress; mood and affect are within normal limits.  A focused examination was performed including perioral, arms and legs, left forearm back. Relevant physical exam findings are noted in the Assessment and Plan.  left forearm Dispigmented patch at left forearm    Assessment & Plan  Atopic dermatitis, unspecified type arms and legs Atopic dermatitis (eczema) is a chronic, relapsing, pruritic condition that can significantly affect quality of life. It is often associated with allergic rhinitis and/or asthma and can require treatment with topical medications, phototherapy, or in severe cases biologic injectable medication (Dupixent; Adbry) or Oral JAK inhibitors.    Discussed sending Opzelura cream to [facility]. Patient declined treatment this time.   Angular cheilitis perioral Discussed can purchase OTC hydrocortisone 1 % cream - apply to aa qd/bid prn until clear  Herpes simplex left forearm Recommend starting Valacyclovir 500 mg tablet when flared take 1 tab bid for 1  week. 28 tabs 11rfs Herpes Simplex Virus = Cold Sores = Fever Blisters is a chronic recurring blistering; scabbing sore-producing viral infection that is recurrent usually in the same area triggered by stress, sun/UV exposure and trauma.  It is infectious and can be spread from person to person by direct contact.  It is not curable, but is treatable with topical and oral medication. valACYclovir (VALTREX) 500 MG tablet - left forearm Take for flares 1 tab twice daily for 1 week.  Return if symptoms worsen or fail to improve.  IRuthell Rummage, CMA, am acting as scribe for Sarina Ser, MD. Documentation: I have reviewed the above documentation for accuracy and completeness, and I agree with the above.  Sarina Ser, MD

## 2022-07-27 NOTE — Patient Instructions (Addendum)
For chelitis at mouth  Can purchase over the counter hydrocortisone 1 % cream - apply one to two times daily as needed until clear.  For cold sores  When flared can Start valtrex 500 mg tab - take 1 tab twice daily for 1 week     Due to recent changes in healthcare laws, you may see results of your pathology and/or laboratory studies on MyChart before the doctors have had a chance to review them. We understand that in some cases there may be results that are confusing or concerning to you. Please understand that not all results are received at the same time and often the doctors may need to interpret multiple results in order to provide you with the best plan of care or course of treatment. Therefore, we ask that you please give [place] 2 business days to thoroughly review all your results before contacting the office for clarification. Should we see a critical lab result, you will be contacted sooner.   If You Need Anything After Your Visit  If you have any questions or concerns for your doctor, please call our main line at [phone] and press option 4 to reach your doctor's medical assistant. If no one answers, please leave a voicemail as directed and we will return your call as soon as possible. Messages left after 4 pm will be answered the following business day.   You may also send [place] a message via [place]. We typically respond to MyChart messages within 1-2 business days.  For prescription refills, please ask your pharmacy to contact our office. Our fax number is [phone].  If you have an urgent issue when the clinic is closed that cannot wait until the next business day, you can page your doctor at the number below.    Please note that while we do our best to be available for urgent issues outside of office hours, we are not available 24/7.   If you have an urgent issue and are unable to reach [place], you may choose to seek medical care at your doctor's office, retail clinic, urgent  care center, or emergency room.  If you have a medical emergency, please immediately call 911 or go to the emergency department.  Pager Numbers  - Dr. Nehemiah Massed: [phone]  - Dr. Laurence Ferrari: [phone]  - Dr. Nicole Kindred: [phone]  In the event of inclement weather, please call our main line at [phone] for an update on the status of any delays or closures.  Dermatology Medication Tips: Please keep the boxes that topical medications come in in order to help keep track of the instructions about where and how to use these. Pharmacies typically print the medication instructions only on the boxes and not directly on the medication tubes.   If your medication is too expensive, please contact our office at [phone] option 4 or send [place] a message through [place].   We are unable to tell what your co-pay for medications will be in advance as this is different depending on your insurance coverage. However, we may be able to find a substitute medication at lower cost or fill out paperwork to get insurance to cover a needed medication.   If a prior authorization is required to get your medication covered by your insurance company, please allow [place] 1-2 business days to complete this process.  Drug prices often vary depending on where the prescription is filled and some pharmacies may offer cheaper prices.  The website www.goodrx.com contains coupons for medications through different  pharmacies. The prices here do not account for what the cost may be with help from insurance (it may be cheaper with your insurance), but the website can give you the price if you did not use any insurance.  - You can print the associated coupon and take it with your prescription to the pharmacy.  - You may also stop by our office during regular business hours and pick up a GoodRx coupon card.  - If you need your prescription sent electronically to a different pharmacy, notify our office through [facility] or  by phone at [phone] option 4.     Si Usted Necesita Algo Despus de Su Visita  Tambin puede enviarnos un mensaje a travs de Pharmacist, community. Por lo general respondemos a los mensajes de MyChart en el transcurso de 1 a 2 das hbiles.  Para renovar recetas, por favor pida a su farmacia que se ponga en contacto con nuestra oficina. Harland Dingwall de fax es [place] [phone].  Si tiene un asunto urgente cuando la clnica est cerrada y que no puede esperar hasta el siguiente da hbil, puede llamar/localizar a su doctor(a) al nmero que aparece a continuacin.   Por favor, tenga en cuenta que aunque hacemos todo lo posible para estar disponibles para asuntos urgentes fuera del horario de [place], no estamos disponibles las 24 horas del da, los 7 das de la [place].   Si tiene un problema urgente y no puede comunicarse con nosotros, puede optar por buscar atencin mdica  en el consultorio de su doctor(a), en una clnica privada, en un centro de atencin urgente o en una sala de emergencias.  Si tiene Engineering geologist, por favor llame inmediatamente al 911 o vaya a la sala de emergencias.  Nmeros de bper  - Dr. Nehemiah Massed: [phone]  - Dra. Moye: [phone]  - Dra. Nicole Kindred: [phone]  En caso de inclemencias del [place], por favor llame a Johnsie Kindred principal al [phone] para una actualizacin sobre el [place] de cualquier retraso o cierre.  Consejos para la medicacin en dermatologa: Por favor, guarde las cajas en las que vienen los medicamentos de uso tpico para ayudarle a seguir las instrucciones sobre dnde y cmo usarlos. Las farmacias generalmente imprimen las instrucciones del medicamento slo en las cajas y no directamente en los tubos del [place].   Si su medicamento es muy caro, por favor, pngase en contacto con Zigmund Daniel llamando al [phone] y presione la opcin 4 o envenos un mensaje a travs de Pharmacist, community.   No podemos decirle cul ser su  copago por los medicamentos por adelantado ya que esto es diferente dependiendo de la cobertura de su seguro. Sin embargo, es posible que podamos encontrar un medicamento sustituto a Electrical engineer un formulario para que el seguro cubra el medicamento que se considera necesario.   Si se requiere una autorizacin previa para que su compaa de seguros [place] su medicamento, por favor permtanos de 1 a 2 das hbiles para completar este proceso.  Los precios de los medicamentos varan con frecuencia dependiendo del Environmental consultant de dnde se surte la receta y alguna farmacias pueden ofrecer precios ms baratos.  El sitio web www.goodrx.com tiene cupones para medicamentos de Airline pilot. Los precios aqu no tienen en cuenta lo que podra costar con la ayuda del seguro (puede ser ms barato con su seguro), pero el sitio web puede darle el precio si no utiliz Research scientist (physical sciences).  - Puede imprimir el cupn correspondiente y llevarlo con su  con su receta a la farmacia.  - Tambin puede pasar por nuestra oficina durante el horario de atencin regular y recoger una tarjeta de cupones de GoodRx.  - Si necesita que su receta se enve electrnicamente a una farmacia diferente, informe a nuestra oficina a travs de MyChart de [place] o por telfono llamando al [phone] y presione la opcin 4.  

## 2022-08-04 ENCOUNTER — Encounter: Payer: Self-pay | Admitting: Dermatology

## 2022-09-04 DIAGNOSIS — L2089 Other atopic dermatitis: Secondary | ICD-10-CM | POA: Diagnosis not present

## 2022-09-13 ENCOUNTER — Encounter: Payer: Self-pay | Admitting: Family Medicine

## 2022-09-25 NOTE — Progress Notes (Unsigned)
   Acute Office Visit  Subjective:     Patient ID: Chieko Pross, female    DOB: 1995/07/19, 27 y.o.   MRN: [id number]  No chief complaint on file.   HPI Patient is in today for sore throat.   URI Compliant:   -Worst symptom: -Fever: {Blank single:19197::"yes","no"} -Cough: {Blank single:19197::"yes","no"} -Shortness of breath: {Blank single:19197::"yes","no"} -Wheezing: {Blank single:19197::"yes","no"} -Chest pain: {Blank single:19197::"yes","no","yes, with cough"} -Chest tightness: {Blank single:19197::"yes","no"} -Chest congestion: {Blank single:19197::"yes","no"} -Nasal congestion: {Blank single:19197::"yes","no"} -Runny nose: {Blank single:19197::"yes","no"} -Post nasal drip: {Blank single:19197::"yes","no"} -Sneezing: {Blank single:19197::"yes","no"} -Sore throat: {Blank single:19197::"yes","no"} -Swollen glands: {Blank single:19197::"yes","no"} -Sinus pressure: {Blank single:19197::"yes","no"} -Headache: {Blank single:19197::"yes","no"} -Face pain: {Blank single:19197::"yes","no"} -Toothache: {Blank single:19197::"yes","no"} -Ear pain: {Blank single:19197::"yes","no"} {Blank single:19197::""right","left", "bilateral"} -Ear pressure: {Blank single:19197::"yes","no"} {Blank single:19197::""right","left", "bilateral"} -Eyes red/itching:{Blank single:19197::"yes","no"} -Eye drainage/crusting: {Blank single:19197::"yes","no"}  -Vomiting: {Blank single:19197::"yes","no"} -Rash: {Blank single:19197::"yes","no"} -Fatigue: {Blank single:19197::"yes","no"} -Sick contacts: {Blank single:19197::"yes","no"} -Strep contacts: {Blank single:19197::"yes","no"}  -Context: {Blank multiple:19196::"better","worse","stable","fluctuating"} -Recurrent sinusitis: {Blank single:19197::"yes","no"} -Relief with OTC cold/cough medications: {Blank single:19197::"yes","no"}  -Treatments attempted: {Blank  multiple:19196::"none","cold/sinus","mucinex","anti-histamine","pseudoephedrine","cough syrup","antibiotics"}    ROS      Objective:    There were no vitals taken for this visit. {Vitals History (Optional):23777}  Physical Exam  No results found for any visits on 09/26/22.      Assessment & Plan:   Problem List Items Addressed This Visit   None   No orders of the defined types were placed in this encounter.   No follow-ups on file.  Teodora Medici, DO

## 2022-09-26 ENCOUNTER — Ambulatory Visit (INDEPENDENT_AMBULATORY_CARE_PROVIDER_SITE_OTHER): Payer: BC Managed Care – PPO | Admitting: Internal Medicine

## 2022-09-26 ENCOUNTER — Encounter: Payer: Self-pay | Admitting: Internal Medicine

## 2022-09-26 VITALS — BP 116/82 | HR 92 | Temp 98.1°F | Resp 18 | Ht 61.0 in | Wt 130.2 lb

## 2022-09-26 DIAGNOSIS — J029 Acute pharyngitis, unspecified: Secondary | ICD-10-CM | POA: Diagnosis not present

## 2022-09-26 DIAGNOSIS — J301 Allergic rhinitis due to pollen: Secondary | ICD-10-CM | POA: Diagnosis not present

## 2022-09-26 LAB — POCT RAPID STREP A (OFFICE): Rapid Strep A Screen: NEGATIVE

## 2022-09-26 NOTE — Patient Instructions (Signed)
It was great seeing you today!  Plan discussed at today's visit: -Rapid strep test in the office negative, COVID test pending -For now, recommend Allegra D to help with allergies and congestion. Once symptoms improve, can switch to daily oral anti-histamine like regular Allegra, Claritin or Zyrtec for the remainder of the season -Could also do a nasal saline and nasal steroid to help with symptoms as well (Flonase - green container, can get over the counter)  Follow up in: as needed  Take care and let us know if you have any questions or concerns prior to your next visit.  Dr. Rosana Berger

## 2022-09-28 LAB — SPECIMEN STATUS REPORT

## 2022-09-28 LAB — NOVEL CORONAVIRUS, NAA: SARS-CoV-2, NAA: NOT DETECTED

## 2022-10-03 ENCOUNTER — Other Ambulatory Visit: Payer: Self-pay | Admitting: Internal Medicine

## 2022-10-03 ENCOUNTER — Encounter: Payer: Self-pay | Admitting: Internal Medicine

## 2022-10-03 DIAGNOSIS — J069 Acute upper respiratory infection, unspecified: Secondary | ICD-10-CM

## 2022-10-03 DIAGNOSIS — R051 Acute cough: Secondary | ICD-10-CM

## 2022-10-03 MED ORDER — METHYLPREDNISOLONE 4 MG PO TBPK: ORAL_TABLET | ORAL | 0 refills | Status: DC

## 2022-10-04 ENCOUNTER — Encounter: Payer: Self-pay | Admitting: Internal Medicine

## 2022-11-02 ENCOUNTER — Encounter: Payer: Self-pay | Admitting: Internal Medicine

## 2022-11-02 ENCOUNTER — Ambulatory Visit (INDEPENDENT_AMBULATORY_CARE_PROVIDER_SITE_OTHER): Payer: BC Managed Care – PPO | Admitting: Internal Medicine

## 2022-11-02 VITALS — BP 112/68 | HR 131 | Temp 100.8°F | Resp 16 | Ht 61.0 in | Wt 132.2 lb

## 2022-11-02 DIAGNOSIS — R051 Acute cough: Secondary | ICD-10-CM | POA: Diagnosis not present

## 2022-11-02 DIAGNOSIS — J020 Streptococcal pharyngitis: Secondary | ICD-10-CM

## 2022-11-02 DIAGNOSIS — J029 Acute pharyngitis, unspecified: Secondary | ICD-10-CM | POA: Diagnosis not present

## 2022-11-02 LAB — POCT RAPID STREP A (OFFICE): Rapid Strep A Screen: POSITIVE — AB

## 2022-11-02 MED ORDER — BENZONATATE 100 MG PO CAPS: 100.0000 mg | ORAL_CAPSULE | Freq: Two times a day (BID) | ORAL | 0 refills | Status: DC | PRN

## 2022-11-02 MED ORDER — PENICILLIN V POTASSIUM 500 MG PO TABS: 500.0000 mg | ORAL_TABLET | Freq: Three times a day (TID) | ORAL | 0 refills | Status: AC

## 2022-11-02 NOTE — Patient Instructions (Signed)
It was great seeing you today!  Plan discussed at today's visit: -Antibiotic prescribed to take 3 times a day for 10 days -Cough suppressant sent as well -Get some Ibuprofen and Tylenol to alternate to treat fevers -COVID test pending  Follow up in: if symptoms worsen or fail to improve  Take care and let us know if you have any questions or concerns prior to your next visit.  Dr. Caralee Ates  Strep Throat, Adult Strep throat is an infection in the throat that is caused by bacteria. It is common during the cold months of the year. It mostly affects children who are 44-80 years old. However, people of all ages can get it at any time of the year. This infection spreads from person to person (is contagious) through coughing, sneezing, or having close contact. Your health care provider may use other names to describe the infection. When strep throat affects the tonsils, it is called tonsillitis. When it affects the back of the throat, it is called pharyngitis. What are the causes? This condition is caused by the Streptococcus pyogenes bacteria. What increases the risk? You are more likely to develop this condition if: You care for school-age children, or are around school-age children. Children are more likely to get strep throat and may spread it to others. You spend time in crowded places where the infection can spread easily. You have close contact with someone who has strep throat. What are the signs or symptoms? Symptoms of this condition include: Fever or chills. Redness, swelling, or pain in the tonsils or throat. Pain or difficulty when swallowing. White or yellow spots on the tonsils or throat. Tender glands in the neck and under the jaw. Bad smelling breath. Red rash all over the body. This is rare. How is this diagnosed? This condition is diagnosed by tests that check for the presence and the amount of bacteria that cause strep throat. They are: Rapid strep test. Your throat is  swabbed and checked for the presence of bacteria. Results are usually ready in minutes. Throat culture test. Your throat is swabbed. The sample is placed in a cup that allows infections to grow. Results are usually ready in 1 or 2 days. How is this treated? This condition may be treated with: Medicines that kill germs (antibiotics). Medicines that relieve pain or fever. These include: Ibuprofen or acetaminophen. Aspirin, only for people who are over the age of 57. Throat lozenges. Throat sprays. Follow these instructions at home: Medicines  Take over-the-counter and prescription medicines only as told by your health care provider. Take your antibiotic medicine as told by your health care provider. Do not stop taking the antibiotic even if you start to feel better. Eating and drinking  If you have trouble swallowing, try eating soft foods until your sore throat feels better. Drink enough fluid to keep your urine pale yellow. To help relieve pain, you may have: Warm fluids, such as soup and tea. Cold fluids, such as frozen desserts or popsicles. General instructions Gargle with a salt-water mixture 3-4 times a day or as needed. To make a salt-water mixture, completely dissolve -1 tsp (3-6 g) of salt in 1 cup (237 mL) of warm water. Get plenty of rest. Stay home from work or school until you have been taking antibiotics for 24 hours. Do not use any products that contain nicotine or tobacco. These products include cigarettes, chewing tobacco, and vaping devices, such as e-cigarettes. If you need help quitting, ask your health care provider.  It is up to you to get your test results. Ask your health care provider, or the department that is doing the test, when your results will be ready. Keep all follow-up visits. This is important. How is this prevented?  Do not share food, drinking cups, or personal items that could cause the infection to spread to other people. Wash your hands often  with soap and water for at least 20 seconds. If soap and water are not available, use hand sanitizer. Make sure that all people in your house wash their hands well. Have family members tested if they have a sore throat or fever. They may need an antibiotic if they have strep throat. Contact a health care provider if: You have swelling in your neck that keeps getting bigger. You develop a rash, cough, or earache. You cough up a thick mucus that is green, yellow-brown, or bloody. You have pain or discomfort that does not get better with medicine. Your symptoms seem to be getting worse. You have a fever. Get help right away if: You have new symptoms, such as vomiting, severe headache, stiff or painful neck, chest pain, or shortness of breath. You have severe throat pain, drooling, or changes in your voice. You have swelling of the neck, or the skin on the neck becomes red and tender. You have signs of dehydration, such as tiredness (fatigue), dry mouth, and decreased urination. You become increasingly sleepy, or you cannot wake up completely. Your joints become red or painful. These symptoms may represent a serious problem that is an emergency. Do not wait to see if the symptoms will go away. Get medical help right away. Call your local emergency services (911 in the [place] Do not drive yourself to the hospital. Summary Strep throat is an infection in the throat that is caused by the Streptococcus pyogenes bacteria. This infection is spread from person to person (is contagious) through coughing, sneezing, or having close contact. Take your medicines, including antibiotics, as told by your health care provider. Do not stop taking the antibiotic even if you start to feel better. To prevent the spread of germs, wash your hands well with soap and water. Have others do the same. Do not share food, drinking cups, or personal items. Get help right away if you have new symptoms, such as vomiting, severe  headache, stiff or painful neck, chest pain, or shortness of breath. This information is not intended to replace advice given to you by your health care provider. Make sure you discuss any questions you have with your health care provider. Document Revised: 10/12/2020 Document Reviewed: 10/12/2020 Elsevier Patient Education  2023 [employer].

## 2022-11-02 NOTE — Progress Notes (Signed)
   Acute Office Visit  Subjective:     Patient ID: Heather Schmidt, female    DOB: Apr 28, 1996, 27 y.o.   MRN: [id number]  Chief Complaint  Patient presents with   URI    Started last nigh, congestion, cough, sore throat, weakness    HPI Patient is in today for cough, congestion, sore throat. Symptoms started last night.   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 Negative for chills.  HENT:  Positive for congestion, sinus pain and sore throat. Negative for ear pain.   Respiratory:  Positive for cough. Negative for wheezing.   Cardiovascular:  Negative for chest pain.        Objective:    BP 112/68   Pulse (!) 131   Temp (!) 100.8 F (38.2 C)   Resp 16   Ht 5\' 1"  (1.549 m)   Wt 132 lb 3.2 oz (60 kg)   SpO2 97%   BMI 24.98 kg/m    Physical Exam Constitutional:      Appearance: She is ill-appearing.  HENT:     Head: Normocephalic and atraumatic.     Right Ear: Tympanic membrane, ear canal and external ear normal.     Left Ear: Tympanic membrane, ear canal and external ear normal.     Nose: Congestion present.     Mouth/Throat:     Mouth: Mucous membranes are moist.     Pharynx: Posterior oropharyngeal erythema present. No oropharyngeal exudate.  Eyes:     Conjunctiva/sclera: Conjunctivae normal.  Cardiovascular:     Rate and Rhythm: Regular rhythm. Tachycardia present.  Pulmonary:     Effort: Pulmonary effort is normal.     Breath sounds: Normal breath sounds. No wheezing, rhonchi or rales.  Musculoskeletal:     Cervical back: No tenderness.  Lymphadenopathy:      Cervical: No cervical adenopathy.  Skin:    General: Skin is warm and dry.  Neurological:     General: No focal deficit present.     Mental Status: She is alert. Mental status is at baseline.  Psychiatric:        Mood and Affect: Mood normal.        Behavior: Behavior normal.     No results found for any visits on 11/02/22.      Assessment & Plan:   1. Strep throat/Sore throat/Acute cough: Rapid strep here positive. Will treat with Penicillin 500 mg TID x 10 days. Will also prescribe cough suppressants. Discussed alternating anti-inflammatories and Tylenol as needed for fever. Will rest and stay well hydrated, work note provided. COVID test pending.  - penicillin v potassium (VEETID) 500 MG tablet; Take 1 tablet (500 mg total) by mouth 3 (three) times daily for 10 days.  Dispense: 30 tablet; Refill: 0 - Novel Coronavirus, NAA ([facility]) - benzonatate (TESSALON) 100 MG capsule; Take 1 capsule (100 mg total) by mouth 2 (two) times daily as needed for cough.  Dispense: 20 capsule; Refill: 0   Return if symptoms worsen or fail to improve.  [name], DO

## 2022-11-04 LAB — NOVEL CORONAVIRUS, NAA: SARS-CoV-2, NAA: NOT DETECTED

## 2022-11-13 ENCOUNTER — Observation Stay
Admission: EM | Admit: 2022-11-13 | Discharge: 2022-11-15 | Disposition: A | Discharge status: Home / Self Care | Payer: BC Managed Care – PPO | Attending: Internal Medicine | Admitting: Internal Medicine

## 2022-11-13 ENCOUNTER — Ambulatory Visit: Payer: Self-pay | Admitting: *Deleted

## 2022-11-13 ENCOUNTER — Encounter: Payer: Self-pay | Admitting: Emergency Medicine

## 2022-11-13 ENCOUNTER — Encounter: Payer: Self-pay | Admitting: Internal Medicine

## 2022-11-13 ENCOUNTER — Other Ambulatory Visit: Payer: Self-pay

## 2022-11-13 ENCOUNTER — Emergency Department: Payer: BC Managed Care – PPO

## 2022-11-13 DIAGNOSIS — R821 Myoglobinuria: Secondary | ICD-10-CM | POA: Diagnosis not present

## 2022-11-13 DIAGNOSIS — R7401 Elevation of levels of liver transaminase levels: Secondary | ICD-10-CM | POA: Diagnosis not present

## 2022-11-13 DIAGNOSIS — Z91010 Allergy to peanuts: Secondary | ICD-10-CM | POA: Diagnosis not present

## 2022-11-13 DIAGNOSIS — Z91012 Allergy to eggs: Secondary | ICD-10-CM | POA: Diagnosis not present

## 2022-11-13 DIAGNOSIS — M79622 Pain in left upper arm: Secondary | ICD-10-CM | POA: Diagnosis not present

## 2022-11-13 DIAGNOSIS — M6282 Rhabdomyolysis: Secondary | ICD-10-CM | POA: Diagnosis not present

## 2022-11-13 DIAGNOSIS — M6088 Other myositis, other site: Secondary | ICD-10-CM | POA: Diagnosis not present

## 2022-11-13 DIAGNOSIS — G47419 Narcolepsy without cataplexy: Secondary | ICD-10-CM | POA: Diagnosis not present

## 2022-11-13 DIAGNOSIS — R748 Abnormal levels of other serum enzymes: Secondary | ICD-10-CM | POA: Diagnosis not present

## 2022-11-13 DIAGNOSIS — T796XXA Traumatic ischemia of muscle, initial encounter: Secondary | ICD-10-CM

## 2022-11-13 DIAGNOSIS — R809 Proteinuria, unspecified: Secondary | ICD-10-CM | POA: Diagnosis not present

## 2022-11-13 DIAGNOSIS — M79661 Pain in right lower leg: Secondary | ICD-10-CM | POA: Diagnosis not present

## 2022-11-13 DIAGNOSIS — Z8249 Family history of ischemic heart disease and other diseases of the circulatory system: Secondary | ICD-10-CM

## 2022-11-13 DIAGNOSIS — Z8616 Personal history of COVID-19: Secondary | ICD-10-CM

## 2022-11-13 DIAGNOSIS — R319 Hematuria, unspecified: Secondary | ICD-10-CM | POA: Diagnosis not present

## 2022-11-13 DIAGNOSIS — Z1152 Encounter for screening for COVID-19: Secondary | ICD-10-CM

## 2022-11-13 DIAGNOSIS — Z91040 Latex allergy status: Secondary | ICD-10-CM

## 2022-11-13 DIAGNOSIS — R7989 Other specified abnormal findings of blood chemistry: Secondary | ICD-10-CM

## 2022-11-13 DIAGNOSIS — Z91013 Allergy to seafood: Secondary | ICD-10-CM

## 2022-11-13 DIAGNOSIS — M791 Myalgia, unspecified site: Secondary | ICD-10-CM | POA: Diagnosis not present

## 2022-11-13 DIAGNOSIS — R531 Weakness: Secondary | ICD-10-CM | POA: Diagnosis not present

## 2022-11-13 DIAGNOSIS — M79621 Pain in right upper arm: Secondary | ICD-10-CM | POA: Diagnosis not present

## 2022-11-13 DIAGNOSIS — R945 Abnormal results of liver function studies: Secondary | ICD-10-CM | POA: Diagnosis not present

## 2022-11-13 DIAGNOSIS — J45909 Unspecified asthma, uncomplicated: Secondary | ICD-10-CM | POA: Diagnosis present

## 2022-11-13 DIAGNOSIS — B2709 Gammaherpesviral mononucleosis with other complications: Secondary | ICD-10-CM | POA: Diagnosis not present

## 2022-11-13 DIAGNOSIS — M609 Myositis, unspecified: Secondary | ICD-10-CM | POA: Diagnosis not present

## 2022-11-13 DIAGNOSIS — Z91018 Allergy to other foods: Secondary | ICD-10-CM

## 2022-11-13 DIAGNOSIS — Z825 Family history of asthma and other chronic lower respiratory diseases: Secondary | ICD-10-CM

## 2022-11-13 LAB — URINALYSIS, ROUTINE W REFLEX MICROSCOPIC
Bacteria, UA: NONE SEEN
Bilirubin Urine: NEGATIVE
Glucose, UA: NEGATIVE mg/dL
Ketones, ur: NEGATIVE mg/dL
Leukocytes,Ua: NEGATIVE
Nitrite: NEGATIVE
Protein, ur: 30 mg/dL — AB
Specific Gravity, Urine: 1.01 (ref 1.005–1.030)
pH: 6 (ref 5.0–8.0)

## 2022-11-13 LAB — CBC
HCT: 37.8 % (ref 36.0–46.0)
Hemoglobin: 12.3 g/dL (ref 12.0–15.0)
MCH: 28.9 pg (ref 26.0–34.0)
MCHC: 32.5 g/dL (ref 30.0–36.0)
MCV: 88.9 fL (ref 80.0–100.0)
Platelets: 307 10*3/uL (ref 150–400)
RBC: 4.25 MIL/uL (ref 3.87–5.11)
RDW: 12.6 % (ref 11.5–15.5)
WBC: 9.5 10*3/uL (ref 4.0–10.5)
nRBC: 0 % (ref 0.0–0.2)

## 2022-11-13 LAB — BASIC METABOLIC PANEL
Anion gap: 9 (ref 5–15)
BUN: 12 mg/dL (ref 6–20)
CO2: 23 mmol/L (ref 22–32)
Calcium: 9 mg/dL (ref 8.9–10.3)
Chloride: 106 mmol/L (ref 98–111)
Creatinine, Ser: 0.57 mg/dL (ref 0.44–1.00)
GFR, Estimated: >60 mL/min (ref >60)
Glucose, Bld: 112 mg/dL — ABNORMAL HIGH (ref 70–99)
Potassium: 3.9 mmol/L (ref 3.5–5.1)
Sodium: 138 mmol/L (ref 135–145)

## 2022-11-13 LAB — SEDIMENTATION RATE: Sed Rate: 30 mm/hr — ABNORMAL HIGH (ref 0–20)

## 2022-11-13 LAB — HEPATIC FUNCTION PANEL
ALT: 155 U/L — ABNORMAL HIGH (ref 0–44)
AST: 747 U/L — ABNORMAL HIGH (ref 15–41)
Albumin: 3.9 g/dL (ref 3.5–5.0)
Alkaline Phosphatase: 65 U/L (ref 38–126)
Bilirubin, Direct: 0.1 mg/dL (ref 0.0–0.2)
Indirect Bilirubin: 0.9 mg/dL (ref 0.3–0.9)
Total Bilirubin: 1 mg/dL (ref 0.3–1.2)
Total Protein: 7.1 g/dL (ref 6.5–8.1)

## 2022-11-13 LAB — C-REACTIVE PROTEIN: CRP: 0.9 mg/dL (ref <1.0)

## 2022-11-13 LAB — URINE DRUG SCREEN, QUALITATIVE (ARMC ONLY)
Amphetamines, Ur Screen: NOT DETECTED
Barbiturates, Ur Screen: NOT DETECTED
Benzodiazepine, Ur Scrn: NOT DETECTED
Cannabinoid 50 Ng, Ur ~~LOC~~: NOT DETECTED
Cocaine Metabolite,Ur ~~LOC~~: NOT DETECTED
MDMA (Ecstasy)Ur Screen: NOT DETECTED
Methadone Scn, Ur: NOT DETECTED
Opiate, Ur Screen: NOT DETECTED
Phencyclidine (PCP) Ur S: NOT DETECTED
Tricyclic, Ur Screen: NOT DETECTED

## 2022-11-13 LAB — HEPATITIS PANEL, ACUTE
HCV Ab: NONREACTIVE
Hep A IgM: NONREACTIVE
Hep B C IgM: NONREACTIVE
Hepatitis B Surface Ag: NONREACTIVE

## 2022-11-13 LAB — TSH: TSH: 1.41 u[IU]/mL (ref 0.350–4.500)

## 2022-11-13 LAB — ACETAMINOPHEN LEVEL: Acetaminophen (Tylenol), Serum: 12 ug/mL (ref 10–30)

## 2022-11-13 LAB — CK: Total CK: >50000 U/L — ABNORMAL HIGH (ref 38–234)

## 2022-11-13 MED ORDER — SODIUM CHLORIDE 0.9 % IV BOLUS
1000.0000 mL | Freq: Once | INTRAVENOUS | Status: AC
  Administered 2022-11-13: 1000 mL via INTRAVENOUS

## 2022-11-13 MED ORDER — ACETAMINOPHEN 325 MG PO TABS
650.0000 mg | ORAL_TABLET | Freq: Once | ORAL | Status: AC
  Administered 2022-11-13: 650 mg via ORAL
  Filled 2022-11-13: qty 2

## 2022-11-13 MED ORDER — SODIUM CHLORIDE 0.9 % IV SOLN: INTRAVENOUS | Status: DC

## 2022-11-13 NOTE — ED Notes (Addendum)
See triage note  Presents with some muscle soreness  States she noticed this about 1 week ago   No fever or truama

## 2022-11-13 NOTE — ED Triage Notes (Signed)
Pt to ED for dark urine and muscle pain for one week.

## 2022-11-13 NOTE — H&P (Signed)
History and Physical    Patient: Heather Schmidt ZOX:[id number] DOB: 05/01/1996 DOA: 11/13/2022 DOS: the patient was seen and examined on 11/13/2022 PCP: [name], DO  Patient coming from: Home  Chief Complaint: No chief complaint on file.  HPI: Heather Schmidt is a 27 y.o. female with medical history significant of asthma presenting with rhabdomyolysis.  Patient reports being recently diagnosed with strep throat approximately 2 weeks ago.  Patient states she was strep positive at her doctor and placed on a course of penicillin VK.  Patient reports approximately 4 to 5 days into disease course and treatment, she started noticing her urine becoming more dark as well as generalized weakness and fatigue.  Patient denies any prior symptoms like this in the past.  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  No significant family history apart from thyroid disease.  No tobacco or alcohol use.  Weakness and fatigue has been generalized across her entire body.  No nausea or vomiting.  No chest pain or shortness of breath.  No reported joint pain.  No slurred speech or confusion.  No rash. Presented to the ER afebrile, hemodynamically stable.  White count 9.5, hemoglobin 12.3, platelets 307, creatinine 0.6, urinalysis clear with large hemoglobin and 30 of protein, CK level of 50,000, AST 747, ALT 155. Review of Systems: As mentioned in the history of present illness. All other systems reviewed and are negative. Past Medical History:  Diagnosis Date   Asthma    Chlamydia 06/06/2019   COVID-19 06/23/2020   Depression    Narcolepsy    Past Surgical History:  Procedure Laterality Date   WISDOM TOOTH EXTRACTION     Social History:  reports that she has never smoked. She has never used smokeless tobacco. She reports current alcohol use. She reports that she does not currently use drugs after having  used the following drugs: Marijuana.  Allergies  Allergen Reactions   Fish Allergy Anaphylaxis   Peanut-Containing Drug Products Anaphylaxis   Shellfish Allergy Anaphylaxis   Egg [Egg-Derived Products] Nausea And Vomiting   Other Cough    Ginger and barley   Latex Rash    Family History  Problem Relation Age of Onset   Asthma Mother    Thyroid disease Mother    Asthma Father    Cancer Maternal Grandmother    COPD Maternal Grandmother    Hypertension Paternal Grandmother    Hypertension Paternal Grandfather     Prior to Admission medications   Medication Sig Start Date End Date Taking? Authorizing Provider  albuterol (VENTOLIN HFA) 108 (90 Base) MCG/ACT inhaler Inhale 2 puffs into the lungs every 6 (six) hours as needed for wheezing. 05/17/22  Yes [name], DO  benzonatate (TESSALON) 100 MG capsule Take 1 capsule (100 mg total) by mouth 2 (two) times daily as needed for cough. Patient not taking: Reported on 11/13/2022 11/02/22   [name], DO  valACYclovir (VALTREX) 500 MG tablet Take for flares 1 tab twice daily for 1 week. Patient not taking: Reported on 11/13/2022 07/27/22   Deirdre Evener, MD    Physical Exam: Vitals:   11/13/22 1215 11/13/22 1216  BP: 121/64   Pulse: 79   Resp: 17   Temp: 98.2 F (36.8 C)   TempSrc: Oral   SpO2: 98%   Weight:  59.9 kg  Height:  5\' 2"  (1.575 m)   Physical Exam Constitutional:  Appearance: She is normal weight.  HENT:     Head: Normocephalic and atraumatic.     Nose: Nose normal.     Mouth/Throat:     Mouth: Mucous membranes are moist.  Eyes:     Pupils: Pupils are equal, round, and reactive to light.  Cardiovascular:     Rate and Rhythm: Normal rate and regular rhythm.  Pulmonary:     Effort: Pulmonary effort is normal.  Abdominal:     General: Bowel sounds are normal.  Musculoskeletal:        General: Normal range of motion.     Cervical back: Normal range of motion.  Skin:    General: Skin  is warm.  Neurological:     General: No focal deficit present.  Psychiatric:        Mood and Affect: Mood normal.     Data Reviewed:  There are no new results to review at this time. Lab Results  Component Value Date   WBC 9.5 11/13/2022   HGB 12.3 11/13/2022   HCT 37.8 11/13/2022   MCV 88.9 11/13/2022   PLT 307 11/13/2022   Last metabolic panel Lab Results  Component Value Date   GLUCOSE 112 (H) 11/13/2022   NA 138 11/13/2022   K 3.9 11/13/2022   CL 106 11/13/2022   CO2 23 11/13/2022   BUN 12 11/13/2022   CREATININE 0.57 11/13/2022   GFRNONAA >60 11/13/2022   CALCIUM 9.0 11/13/2022   PROT 7.1 11/13/2022   ALBUMIN 3.9 11/13/2022   BILITOT 1.0 11/13/2022   ALKPHOS 65 11/13/2022   AST 747 (H) 11/13/2022   ALT 155 (H) 11/13/2022   ANIONGAP 9 11/13/2022    Assessment and Plan: * Rhabdomyolysis Patient with generalized muscle weakness and fatigue x 1 to 2 weeks in the setting of recent strep throat infection CK level greater than 50,000 Differential includes poststreptococcal rhabdomyolysis versus medication induced versus subacute autoimmune myositis Will plan to aggressively hydrate patient with IV fluids Trend CK Urinalysis grossly stable Given unique nature of case, case discussed with Dr. Wynelle Link with nephrology as well as Dr. Noralee Space with infectious disease Will also add on inflammatory markers including sed rate, CRP, ANA Consider rheumatological workup if inflammatory markers are significantly elevated Dr. Allena Katz with rheumatology also made aware of case-please contact and formally consult if inflammatory markers are indicative  Transaminitis AST 700s, ALT 100s Suspect secondary to rhabdomyolysis Will also check right upper quadrant ultrasound as well as hepatitis panel Check Tylenol level Monitor      Advance Care Planning:   Code Status: Not on file   Consults: Dr. Wynelle Link w/ nephrology, r/u w/ Dr. Allena Katz w/ rhaumatology if inflammatory markers  indicative   Family Communication: Family at the bedside   Severity of Illness: The appropriate patient status for this patient is OBSERVATION. Observation status is judged to be reasonable and necessary in order to provide the required intensity of service to ensure the patient's safety. The patient's presenting symptoms, physical exam findings, and initial radiographic and laboratory data in the context of their medical condition is felt to place them at decreased risk for further clinical deterioration. Furthermore, it is anticipated that the patient will be medically stable for discharge from the hospital within 2 midnights of admission.   Author: Floydene Flock, MD 11/13/2022 4:07 PM  For on call review [email or web address].

## 2022-11-13 NOTE — Telephone Encounter (Signed)
Summary: rx concern   The patient would like to speak with a member of staff when possible  The patient shares that they have been recently prescribed medication that's causing concern  The patient shares that they have noticed their urine is darker than normal and they've experienced muscle soreness  The patient shares that they have experienced these symptoms for roughly 1 week  Please contact the patient further when possible (after 11 AM if that's an option)           Chief Complaint: muscle soreness Symptoms: muscle soreness and pain since dx strep 11/02/22. Dark reddish urine noted. Weakness getting out of bed this am  Frequency: 1 week  Pertinent Negatives: Patient denies chest pain no difficulty breathing no fever no pain with urination no abdominal discomfort.  Disposition: [x] ED /[] Urgent Care (no appt availability in office) / [] Appointment(In office/virtual)/ []  [facility]/ [] Home Care/ [] Refused Recommended Disposition /[] [facility]/ []  Follow-up with PCP Additional Notes:   Recommended ED now. Patient verbalized understanding.      Reason for Disposition  Dark (cola or tea-colored) or red-colored urine  Answer Assessment - Initial Assessment Questions 1. ONSET: "When did the muscle aches or body pains start?"      Last week  2. LOCATION: "What part of your body is hurting?" (e.g., entire body, arms, legs)      All over legs most and arms and back chest  3. SEVERITY: "How bad is the pain?" (Scale 1-10; or mild, moderate, severe)   - MILD (1-3): doesn't interfere with normal activities    - MODERATE (4-7): interferes with normal activities or awakens from sleep    - SEVERE (8-10):  excruciating pain, unable to do any normal activities      Hurts to bend over and put on clothes . Stiff when waking up  4. CAUSE: "What do you think is causing the pains?"     Not sure  5. FEVER: "Have you been having fever?"     na 6. OTHER SYMPTOMS: "Do  you have any other symptoms?" (e.g., chest pain, weakness, rash, cold or flu symptoms, weight loss)     Had strep 11/02/22. Muscle pains all over. Weakness standing urine dark reddish in color  7. PREGNANCY: "Is there any chance you are pregnant?" "When was your last menstrual period?"     Na  8. TRAVEL: "Have you traveled out of the country in the last month?" (e.g., travel history, exposures)     na  Protocols used: Muscle Aches and Body Pain-A-AH

## 2022-11-13 NOTE — Assessment & Plan Note (Signed)
AST 700s, ALT 100s Suspect secondary to rhabdomyolysis Will also check right upper quadrant ultrasound as well as hepatitis panel Check Tylenol level Monitor

## 2022-11-13 NOTE — Telephone Encounter (Signed)
Got vm, pt currently at ER

## 2022-11-13 NOTE — Assessment & Plan Note (Addendum)
Patient with generalized muscle weakness and fatigue x 1 to 2 weeks in the setting of recent strep throat infection CK level greater than 50,000 Differential includes poststreptococcal rhabdomyolysis versus medication induced versus subacute autoimmune myositis Will plan to aggressively hydrate patient with IV fluids Trend CK Urinalysis grossly stable Given unique nature of case, case discussed with Dr. Wynelle Link with nephrology as well as Dr. Noralee Space with infectious disease Will also add on inflammatory markers including sed rate, CRP, ANA Consider rheumatological workup if inflammatory markers are significantly elevated Dr. Allena Katz with rheumatology also made aware of case-please contact and formally consult if inflammatory markers are indicative

## 2022-11-13 NOTE — ED Provider Notes (Signed)
[facility] Provider Note    Event Date/Time   First MD Initiated Contact with Patient 11/13/22 1301     (approximate)   History   No chief complaint on file.   HPI  Heather Schmidt is a 27 y.o. female who presents today for evaluation of muscle pain.  Patient reports that she was recently treated for strep throat, and completed the course of antibiotic.  She reports that her sore throat is better, however the over the last couple of days she has developed body aches in her quadriceps and arms.  She also notes that this morning she had very dark urine.  She called her provider who advised her to come to the emergency department.  She denies any exertional activity.  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   Eczema 03/31/2020   Generalized anxiety disorder 12/05/2019   Abnormal cervical Papanicolaou smear 10/31/2019   Vitamin D insufficiency 10/31/2019   Nonintractable episodic headache 10/31/2019   Depression 10/31/2019   History of anaphylaxis 10/31/2019   Narcolepsy 10/08/2019          Physical Exam   Triage Vital Signs: ED Triage Vitals  Enc Vitals Group     BP 11/13/22 1215 121/64     Pulse Rate 11/13/22 1215 79     Resp 11/13/22 1215 17     Temp 11/13/22 1215 98.2 F (36.8 C)     Temp Source 11/13/22 1215 Oral     SpO2 11/13/22 1215 98 %     Weight 11/13/22 1216 132 lb (59.9 kg)     Height 11/13/22 1216 5\' 2"  (1.575 m)     Head Circumference --      Peak Flow --      Pain Score 11/13/22 1223 5     Pain Loc --      Pain Edu? --      Excl. in GC? --     Most recent vital signs: Vitals:   11/13/22 1215  BP: 121/64  Pulse: 79  Resp: 17  Temp: 98.2 F (36.8 C)  SpO2: 98%    Physical Exam Vitals and nursing note reviewed.  Constitutional:      General: Awake and alert. No acute  distress.    Appearance: Normal appearance. The patient is normal weight.  HENT:     Head: Normocephalic and atraumatic.     Mouth: Mucous membranes are moist.  Eyes:     General: PERRL. Normal EOMs        Right eye: No discharge.        Left eye: No discharge.     Conjunctiva/sclera: Conjunctivae normal.  Cardiovascular:     Rate and Rhythm: Normal rate and regular rhythm.     Pulses: Normal pulses.  Pulmonary:     Effort: Pulmonary effort is normal. No respiratory distress.     Breath sounds: Normal breath sounds.  Abdominal:     Abdomen is soft. There is no abdominal tenderness. No rebound or guarding. No distention. Musculoskeletal:        General: No swelling. Normal range of motion.     Cervical back: Normal range of motion and neck supple.  Tenderness to palpation to bilateral thighs and upper arms, there is no skin changes, obvious swelling, erythema, or ecchymosis.  Compartments are soft and compressible throughout.  Normal distal pulses.  Skin:    General: Skin is warm and dry.     Capillary Refill: Capillary refill takes less than 2 seconds.     Findings: No rash.  Neurological:     Mental Status: The patient is awake and alert.      ED Results / Procedures / Treatments   Labs (all labs ordered are listed, but only abnormal results are displayed) Labs Reviewed  BASIC METABOLIC PANEL - Abnormal; Notable for the following components:      Result Value   Glucose, Bld 112 (*)    All other components within normal limits  URINALYSIS, ROUTINE W REFLEX MICROSCOPIC - Abnormal; Notable for the following components:   APPearance CLEAR (*)    Hgb urine dipstick LARGE (*)    Protein, ur 30 (*)    All other components within normal limits  CK - Abnormal; Notable for the following components:   Total CK >50,000 (*)    All other components within normal limits  HEPATIC FUNCTION PANEL - Abnormal; Notable for the following components:   AST 747 (*)    ALT 155 (*)    All  other components within normal limits  CBC  HEPATITIS PANEL, ACUTE     EKG     RADIOLOGY     PROCEDURES:  Critical Care performed:   Procedures   MEDICATIONS ORDERED IN ED: Medications  acetaminophen (TYLENOL) tablet 650 mg (650 mg Oral Given 11/13/22 1317)  sodium chloride 0.9 % bolus 1,000 mL (1,000 mLs Intravenous New Bag/Given 11/13/22 1418)     IMPRESSION / MDM / ASSESSMENT AND PLAN / ED COURSE  I reviewed the triage vital signs and the nursing notes.   Differential diagnosis includes, but is not limited to, rhabdomyolysis, myalgias, influenza, acute renal failure, poststreptococcal glomerulonephritis.  I reviewed the patient's chart.  Patient was seen in clinic on 11/02/2022 and diagnosed with strep throat and was started on penicillin 500 mg 3 times daily for 10 days.  She completed the antibiotics yesterday.  Patient is awake and alert, hemodynamically stable and afebrile.  There is no obvious swelling, ecchymosis, or erythema in her extremities.  Her compartments are soft and compressible throughout.  Further workup is indicated.  Labs obtained are revealing for normal creatinine, no leukocytosis.  She has large blood in her urinalysis which is likely myoglobin.  LFTs are also elevated which is consistent with rhabdomyolysis.  Patient was started on IV fluids.  I recommended admission to the hospitalist service for continued IV hydration.  Patient is in agreement with the plan.  Patient was accepted by the hospitalist Dr. Alvester Morin.   Patient's presentation is most consistent with acute presentation with potential threat to life or bodily function.      FINAL CLINICAL IMPRESSION(S) / ED DIAGNOSES   Final diagnoses:  Non-traumatic rhabdomyolysis  Elevated LFTs     Rx / DC Orders   ED Discharge Orders     None        Note:  This document was prepared using Dragon voice recognition software and may include unintentional dictation errors.   Keturah Shavers 11/13/22 1510    Jene Every, MD 11/13/22 [phone]

## 2022-11-14 DIAGNOSIS — M6282 Rhabdomyolysis: Secondary | ICD-10-CM | POA: Diagnosis not present

## 2022-11-14 DIAGNOSIS — R809 Proteinuria, unspecified: Secondary | ICD-10-CM | POA: Diagnosis present

## 2022-11-14 DIAGNOSIS — Z91013 Allergy to seafood: Secondary | ICD-10-CM | POA: Diagnosis not present

## 2022-11-14 DIAGNOSIS — Z91018 Allergy to other foods: Secondary | ICD-10-CM | POA: Diagnosis not present

## 2022-11-14 DIAGNOSIS — R531 Weakness: Secondary | ICD-10-CM | POA: Diagnosis present

## 2022-11-14 DIAGNOSIS — Z1152 Encounter for screening for COVID-19: Secondary | ICD-10-CM | POA: Diagnosis not present

## 2022-11-14 DIAGNOSIS — R7401 Elevation of levels of liver transaminase levels: Secondary | ICD-10-CM | POA: Diagnosis present

## 2022-11-14 DIAGNOSIS — Z91012 Allergy to eggs: Secondary | ICD-10-CM | POA: Diagnosis not present

## 2022-11-14 DIAGNOSIS — G47419 Narcolepsy without cataplexy: Secondary | ICD-10-CM | POA: Diagnosis present

## 2022-11-14 DIAGNOSIS — R821 Myoglobinuria: Secondary | ICD-10-CM | POA: Diagnosis present

## 2022-11-14 DIAGNOSIS — R7989 Other specified abnormal findings of blood chemistry: Secondary | ICD-10-CM | POA: Diagnosis not present

## 2022-11-14 DIAGNOSIS — Z825 Family history of asthma and other chronic lower respiratory diseases: Secondary | ICD-10-CM | POA: Diagnosis not present

## 2022-11-14 DIAGNOSIS — Z8616 Personal history of COVID-19: Secondary | ICD-10-CM | POA: Diagnosis not present

## 2022-11-14 DIAGNOSIS — Z8249 Family history of ischemic heart disease and other diseases of the circulatory system: Secondary | ICD-10-CM | POA: Diagnosis not present

## 2022-11-14 DIAGNOSIS — J45909 Unspecified asthma, uncomplicated: Secondary | ICD-10-CM | POA: Diagnosis present

## 2022-11-14 DIAGNOSIS — M609 Myositis, unspecified: Secondary | ICD-10-CM | POA: Diagnosis present

## 2022-11-14 DIAGNOSIS — Z91010 Allergy to peanuts: Secondary | ICD-10-CM | POA: Diagnosis not present

## 2022-11-14 DIAGNOSIS — R319 Hematuria, unspecified: Secondary | ICD-10-CM | POA: Diagnosis present

## 2022-11-14 DIAGNOSIS — Z91040 Latex allergy status: Secondary | ICD-10-CM | POA: Diagnosis not present

## 2022-11-14 LAB — COMPREHENSIVE METABOLIC PANEL
ALT: 132 U/L — ABNORMAL HIGH (ref 0–44)
AST: 589 U/L — ABNORMAL HIGH (ref 15–41)
Albumin: 3.3 g/dL — ABNORMAL LOW (ref 3.5–5.0)
Alkaline Phosphatase: 56 U/L (ref 38–126)
Anion gap: 7 (ref 5–15)
BUN: 10 mg/dL (ref 6–20)
CO2: 23 mmol/L (ref 22–32)
Calcium: 8.7 mg/dL — ABNORMAL LOW (ref 8.9–10.3)
Chloride: 108 mmol/L (ref 98–111)
Creatinine, Ser: 0.57 mg/dL (ref 0.44–1.00)
GFR, Estimated: >60 mL/min (ref >60)
Glucose, Bld: 93 mg/dL (ref 70–99)
Potassium: 3.8 mmol/L (ref 3.5–5.1)
Sodium: 138 mmol/L (ref 135–145)
Total Bilirubin: 0.9 mg/dL (ref 0.3–1.2)
Total Protein: 6.2 g/dL — ABNORMAL LOW (ref 6.5–8.1)

## 2022-11-14 LAB — CBC WITH DIFFERENTIAL/PLATELET
Abs Immature Granulocytes: 0.03 10*3/uL (ref 0.00–0.07)
Basophils Absolute: 0 10*3/uL (ref 0.0–0.1)
Basophils Relative: 0 %
Eosinophils Absolute: 0.5 10*3/uL (ref 0.0–0.5)
Eosinophils Relative: 5 %
HCT: 35 % — ABNORMAL LOW (ref 36.0–46.0)
Hemoglobin: 11.2 g/dL — ABNORMAL LOW (ref 12.0–15.0)
Immature Granulocytes: 0 %
Lymphocytes Relative: 23 %
Lymphs Abs: 2.3 10*3/uL (ref 0.7–4.0)
MCH: 28.2 pg (ref 26.0–34.0)
MCHC: 32 g/dL (ref 30.0–36.0)
MCV: 88.2 fL (ref 80.0–100.0)
Monocytes Absolute: 0.5 10*3/uL (ref 0.1–1.0)
Monocytes Relative: 5 %
Neutro Abs: 6.6 10*3/uL (ref 1.7–7.7)
Neutrophils Relative %: 67 %
Platelets: 252 10*3/uL (ref 150–400)
RBC: 3.97 MIL/uL (ref 3.87–5.11)
RDW: 12.7 % (ref 11.5–15.5)
WBC: 9.9 10*3/uL (ref 4.0–10.5)
nRBC: 0 % (ref 0.0–0.2)

## 2022-11-14 LAB — CK
Total CK: 50000 U/L — ABNORMAL HIGH (ref 38–234)
Total CK: >50000 U/L — ABNORMAL HIGH (ref 38–234)

## 2022-11-14 LAB — LACTATE DEHYDROGENASE: LDH: 1398 U/L — ABNORMAL HIGH (ref 98–192)

## 2022-11-14 LAB — MAGNESIUM: Magnesium: 1.9 mg/dL (ref 1.7–2.4)

## 2022-11-14 LAB — ANA W/REFLEX: Anti Nuclear Antibody (ANA): NEGATIVE

## 2022-11-14 LAB — PHOSPHORUS: Phosphorus: 2.9 mg/dL (ref 2.5–4.6)

## 2022-11-14 MED ORDER — CYCLOBENZAPRINE HCL 10 MG PO TABS
5.0000 mg | ORAL_TABLET | Freq: Three times a day (TID) | ORAL | Status: DC | PRN
  Administered 2022-11-14: 5 mg via ORAL
  Filled 2022-11-14: qty 1

## 2022-11-14 MED ORDER — ACETAMINOPHEN 325 MG PO TABS
650.0000 mg | ORAL_TABLET | Freq: Four times a day (QID) | ORAL | Status: DC | PRN
  Administered 2022-11-14: 650 mg via ORAL
  Filled 2022-11-14: qty 2

## 2022-11-14 MED ORDER — HYDROCODONE-ACETAMINOPHEN 5-325 MG PO TABS: 1.0000 | ORAL_TABLET | ORAL | Status: DC | PRN

## 2022-11-14 MED ORDER — ONDANSETRON HCL 4 MG PO TABS: 4.0000 mg | ORAL_TABLET | Freq: Four times a day (QID) | ORAL | Status: DC | PRN

## 2022-11-14 MED ORDER — DEXTROSE 5 % IV SOLN
500.0000 mg | Freq: Four times a day (QID) | INTRAVENOUS | Status: DC | PRN
Start: 2022-11-14 — End: 2022-11-14

## 2022-11-14 MED ORDER — TRAMADOL HCL 50 MG PO TABS: 50.0000 mg | ORAL_TABLET | Freq: Four times a day (QID) | ORAL | Status: DC | PRN

## 2022-11-14 MED ORDER — ONDANSETRON HCL 4 MG/2ML IJ SOLN: 4.0000 mg | Freq: Four times a day (QID) | INTRAMUSCULAR | Status: DC | PRN

## 2022-11-14 MED ORDER — ACETAMINOPHEN 650 MG RE SUPP: 650.0000 mg | Freq: Four times a day (QID) | RECTAL | Status: DC | PRN

## 2022-11-14 NOTE — Progress Notes (Signed)
       CROSS COVER NOTE  NAME: Heather Schmidt MRN: [id number] DOB : 1996-05-13    HPI/Events of Note   Report:S: Reports leg pain unrelieved by Tylenol given at 1317.  B: 27 F medical history of asthma presenting with rhabdomyolysis. Patient reports being recently diagnosed with strep throat approximately 2 weeks ago. Patient states she was strep positive at her doctor and placed on a course of penicillin VK. Patient reports approximately 4 to 5 days into disease course and treatment, she started noticing her urine becoming more dark as well as generalized weakness and fatigue.  A: A/O x4, VSS, ambulatory with steady gait. Describes leg pain and cramping, dull throb, discomfort. Reports minimal relief earlier from Tylenol. NS 150/hr. No meds ordered at this time   On review of chart: Progress notes and renal consult note reviewed  Heather Schmidt is a 27 y.o. black female with depression, asthma and narcolepsy, who was admitted to [facility] on 11/13/2022 for Rhabdomyolysis [M62.82] Elevated LFTs [R79.89] Non-traumatic rhabdomyolysis [M62.82]   Rhabdomyolysis Hematuria and myoglobinuria Proteinuria   Assessment and  Interventions   Assessment: Muscle pain secondary to rhabdo and possible myositis, unrelieved with Tylenol  Plan: Multimodal pain management while avoiding NSAIDs.  Current continue Tylenol with tramadol as needed, possibly add Flexeril.  Continue hydration X X

## 2022-11-14 NOTE — Progress Notes (Signed)
[facility]  - [place] [facility]   PATIENT NAME: Heather Schmidt    MR#:  [id number]  DATE OF BIRTH:  17-May-1996  SUBJECTIVE:  patient's parents at bedside. Ambulating in the room well. Complains of shoulder joint and hip joint weakness as well as pain while putting those joints that he used. No fever nausea vomiting denies any abdominal pain. Recently completed a 10 day course of penicillin V for group a strep pharyngitis as outpatient.    VITALS:  Blood pressure 110/70, pulse 73, temperature 98.2 F (36.8 C), resp. rate 18, height 5\' 2"  (1.575 m), weight 59.9 kg, SpO2 99 %.  PHYSICAL EXAMINATION:   GENERAL:  27 y.o.-year-old patient with no acute distress.  LUNGS: Normal breath sounds bilaterally, no wheezing CARDIOVASCULAR: S1, S2 normal. No murmur   ABDOMEN: Soft, nontender, nondistended. Bowel sounds present.  EXTREMITIES: No  edema b/l.    NEUROLOGIC: nonfocal  patient is alert and awake SKIN: No obvious rash, lesion, or ulcer.   LABORATORY PANEL:  CBC Recent Labs  Lab 11/14/22 1053  WBC 9.9  HGB 11.2*  HCT 35.0*  PLT 252    Chemistries  Recent Labs  Lab 11/14/22 1053  NA PENDING  K PENDING  CL PENDING  CO2 PENDING  GLUCOSE PENDING  BUN 10  CREATININE 0.57  CALCIUM PENDING  MG 1.9  AST 589*  ALT 132*  ALKPHOS 56  BILITOT 0.9   Cardiac Enzymes No results for input(s): "TROPONINI" in the last 168 hours. RADIOLOGY:  US Abdomen Limited RUQ (LIVER/GB)  Result Date: 11/13/2022 CLINICAL DATA:  [id number] Transaminitis [id number] EXAM: ULTRASOUND ABDOMEN LIMITED RIGHT UPPER QUADRANT COMPARISON:  None Available. FINDINGS: Gallbladder: No gallstones or wall thickening visualized. No sonographic Murphy sign noted by sonographer. Common bile duct: Diameter: 3 mm. Liver: No focal lesion identified. Within normal limits in parenchymal echogenicity. Portal vein is patent on color Doppler imaging with normal direction of blood flow towards the liver.  Other: None. IMPRESSION: Unremarkable right upper quadrant ultrasound. Electronically Signed   By: Duanne Guess D.O.   On: 11/13/2022 15:41    Assessment and Plan   Heather Schmidt is a 27 y.o. female with medical history significant of asthma presenting with rhabdomyolysis.  Patient reports being recently diagnosed with strep throat approximately 2 weeks ago.  Patient states she was strep positive at her doctor and placed on a course of penicillin VK.  Patient reports approximately 4 to 5 days into disease course and treatment, she started noticing her urine becoming more dark as well as generalized weakness and fatigue.   Acute rhabdomyolysis unclear etiology/? Myositis -- patient came in with generalized muscle weakness more so in the proximal muscle joints and fatigue for 1 to 2 weeks -- patient just completed antibiotic for group a strep pharyngitis with penicillin V -- CK 50,000 x3 -- nephrology consultation with Dr. Wynelle Link-- workup for myositis initiated. -- Will have patient follow-up with Dr. Allena Katz rheumatology as outpatient -- ESR 30 --LDH 1300 --plt 307 -- CRP within normal limits -- ASO titer, Aldolase, SPEP, Kappa/Lambda chains pending --ANA reflex pending  Transaminitis -- LFTs trending down -- ultrasound liver negative -- acute hepatitis panel nonreactive   Procedures: Family communication : parents in the room Consults : nephrology. Rheumatology has been informed CODE STATUS: full DVT Prophylaxis : ambulation Level of care: Med-Surg Status is: Inpatient Remains inpatient appropriate because: acute rhabdomyolysis. Patient getting aggressive IV fluids    TOTAL TIME TAKING CARE OF THIS  PATIENT: 35 minutes.  >50% time spent on counselling and coordination of care  Note: This dictation was prepared with Dragon dictation along with smaller phrase technology. Any transcriptional errors that result from this process are unintentional.  Enedina Finner M.D     [facility]   CC: Primary care physician; [name], DO

## 2022-11-14 NOTE — ED Provider Notes (Signed)
  PROCEDURES:  Critical Care performed:   .Critical Care  Performed by: Jackelyn Hoehn, PA-C Authorized by: Jackelyn Hoehn, PA-C   Critical care provider statement:    Critical care time (minutes):  35   Critical care time was exclusive of:  Separately billable procedures and treating other patients   Critical care was necessary to treat or prevent imminent or life-threatening deterioration of the following conditions:  Metabolic crisis   Critical care was time spent personally by me on the following activities:  Development of treatment plan with patient or surrogate, discussions with consultants, evaluation of patient's response to treatment, examination of patient, ordering and review of laboratory studies, ordering and review of radiographic studies, ordering and performing treatments and interventions, pulse oximetry, re-evaluation of patient's condition, review of old charts and obtaining history from patient or surrogate   I assumed direction of critical care for this patient from another provider in my specialty: no     Care discussed with: admitting provider       Jackelyn Hoehn, PA-C 11/14/22 8413    Jene Every, MD 11/14/22 1103

## 2022-11-14 NOTE — Consult Note (Signed)
[facility]  CONSULT NOTE    Date: 11/14/2022                  Patient Name:  Heather Schmidt  MRN: [id number]  DOB: 1995-11-28  Age / Sex: 27 y.o., female         PCP: [name], DO                 Service Requesting Consult: Dr. Alvester Morin                 Reason for Consult: Rhabdomyolysis.             History of Present Illness: Ms. Eather Poepping Azucena was diagnosed with step URI 2-3 weeks ago. Patient was placed on pen VK. Patient started noticing dark brown to red urine with fatigue and muscle weakness for last 5 days. Patient is not currently on any prescribed medications, supplements or vitamins.   Patient admitted with rhabdomyolysis, transaminitis and hematuria.   Patient's parents are at bedside. Patient started on IV fluids. No change in kidney function.   Patient denies use of drugs, alcohol or smoking. 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 Patient denies use of NSAIDs. Patient denies every having these symptoms before.   No rash.    Medications: Outpatient medications: Medications Prior to Admission  Medication Sig Dispense Refill Last Dose   albuterol (VENTOLIN HFA) 108 (90 Base) MCG/ACT inhaler Inhale 2 puffs into the lungs every 6 (six) hours as needed for wheezing. 1 each 1 unk   benzonatate (TESSALON) 100 MG capsule Take 1 capsule (100 mg total) by mouth 2 (two) times daily as needed for cough. (Patient not taking: Reported on 11/13/2022) 20 capsule 0 Completed Course   valACYclovir (VALTREX) 500 MG tablet Take for flares 1 tab twice daily for 1 week. (Patient not taking: Reported on 11/13/2022) 28 tablet 11 Completed Course    Current medications: Current Facility-Administered Medications  Medication Dose Route Frequency Provider Last Rate Last Admin   0.9 %  sodium chloride infusion   Intravenous Continuous Floydene Flock, MD 150 mL/hr at 11/14/22 1132 New Bag at 11/14/22 1132      Allergies: Allergies  Allergen Reactions    Fish Allergy Anaphylaxis   Peanut-Containing Drug Products Anaphylaxis   Shellfish Allergy Anaphylaxis   Egg [Egg-Derived Products] Nausea And Vomiting   Other Cough    Ginger and barley   Latex Rash      Past Medical History: Past Medical History:  Diagnosis Date   Asthma    Chlamydia 06/06/2019   COVID-19 06/23/2020   Depression    Narcolepsy      Past Surgical History: Past Surgical History:  Procedure Laterality Date   WISDOM TOOTH EXTRACTION       Family History: Family History  Problem Relation Age of Onset   Asthma Mother    Thyroid disease Mother    Asthma Father    Cancer Maternal Grandmother    COPD Maternal Grandmother    Hypertension Paternal Grandmother    Hypertension Paternal Grandfather      Social History: Social History   Socioeconomic History   Marital status: Media planner    Spouse name: Not on file   Number of children: Not on file   Years of education: Not on file   Highest education level: Some college, no degree  Occupational History   Not on file  Tobacco Use   Smoking status: Never   Smokeless tobacco: Never  Vaping Use   Vaping Use: Never used  Substance and Sexual Activity   Alcohol use: Yes    Comment: rarely   Drug use: Not Currently    Types: Marijuana   Sexual activity: Yes    Birth control/protection: None  Other Topics Concern   Not on file  Social History Narrative   Not on file   Social Determinants of Health   Financial Resource Strain: Not on file  Food Insecurity: No Food Insecurity (11/14/2022)   Hunger Vital Sign    Worried About Running Out of Food in the Last Year: Never true    Ran Out of Food in the Last Year: Never true  Transportation Needs: No Transportation Needs (11/14/2022)   PRAPARE - Administrator, Civil Service (Medical): No    Lack of Transportation (Non-Medical): No  Physical Activity: Not on file  Stress: Not on file  Social Connections: Not on file  Intimate  Partner Violence: Not At Risk (11/14/2022)   Humiliation, Afraid, Rape, and Kick questionnaire    Fear of Current or Ex-Partner: No    Emotionally Abused: No    Physically Abused: No    Sexually Abused: No       Vital Signs: Blood pressure 110/70, pulse 73, temperature 98.2 F (36.8 C), resp. rate 18, height 5\' 2"  (1.575 m), weight 59.9 kg, SpO2 99 %.  Weight trends: Filed Weights   11/13/22 1216  Weight: 59.9 kg    Physical Exam: General: NAD, laying in bed  Head: Normocephalic, atraumatic. Moist oral mucosal membranes  Eyes: Anicteric, PERRL  Neck: Supple, trachea midline  Lungs:  Clear to auscultation  Heart: Regular rate and rhythm  Abdomen:  Soft, nontender,   Extremities:  no peripheral edema. Muscle tenderness  Neurologic: Nonfocal, moving all four extremities  Skin: No lesions         Lab results: Basic Metabolic Panel: Recent Labs  Lab 11/13/22 1225  NA 138  K 3.9  CL 106  CO2 23  GLUCOSE 112*  BUN 12  CREATININE 0.57  CALCIUM 9.0    Liver Function Tests: Recent Labs  Lab 11/13/22 1225  AST 747*  ALT 155*  ALKPHOS 65  BILITOT 1.0  PROT 7.1  ALBUMIN 3.9   No results for input(s): "LIPASE", "AMYLASE" in the last 168 hours. No results for input(s): "AMMONIA" in the last 168 hours.  CBC: Recent Labs  Lab 11/13/22 1225 11/14/22 1053  WBC 9.5 9.9  NEUTROABS  --  6.6  HGB 12.3 11.2*  HCT 37.8 35.0*  MCV 88.9 88.2  PLT 307 252    Cardiac Enzymes: Recent Labs  Lab 11/13/22 1225 11/14/22 0021  CKTOTAL >50,000* 50,000*    BNP: Invalid input(s): "POCBNP"  CBG: No results for input(s): "GLUCAP" in the last 168 hours.  Microbiology: Results for orders placed or performed in visit on 11/02/22  Novel Coronavirus, NAA ([facility])     Status: None   Collection Time: 11/02/22 12:00 AM   Specimen: Nasopharyngeal(NP) swabs in vial transport medium   Nasopharynge  Previous  Result Value Ref Range Status   SARS-CoV-2, NAA Not Detected  Not Detected Final    Comment: This nucleic acid amplification test was developed and its performance characteristics determined by [employer]. Nucleic acid amplification tests include RT-PCR and TMA. This test has not been FDA cleared or approved. This test has been authorized by FDA under an Emergency Use Authorization (EUA). This test is only authorized for the duration  of time the declaration that circumstances exist justifying the authorization of the emergency use of in vitro diagnostic tests for detection of SARS-CoV-2 virus and/or diagnosis of COVID-19 infection under section 564(b)(1) of the Act, 21 U.S.C. 865HQI-6(N) (1), unless the authorization is terminated or revoked sooner. When diagnostic testing is negative, the possibility of a false negative result should be considered in the context of a patient's recent exposures and the presence of clinical signs and symptoms consistent with COVID-19. An individual without symptoms of COVID-19 and who is not shedding SARS-CoV-2 virus wo uld expect to have a negative (not detected) result in this assay.     Coagulation Studies: No results for input(s): "LABPROT", "INR" in the last 72 hours.  Urinalysis: Recent Labs    11/13/22 1225  COLORURINE YELLOW  LABSPEC 1.010  PHURINE 6.0  GLUCOSEU NEGATIVE  HGBUR LARGE*  BILIRUBINUR NEGATIVE  KETONESUR NEGATIVE  PROTEINUR 30*  NITRITE NEGATIVE  LEUKOCYTESUR NEGATIVE      Imaging: US Abdomen Limited RUQ (LIVER/GB)  Result Date: 11/13/2022 CLINICAL DATA:  [id number] Transaminitis [id number] EXAM: ULTRASOUND ABDOMEN LIMITED RIGHT UPPER QUADRANT COMPARISON:  None Available. FINDINGS: Gallbladder: No gallstones or wall thickening visualized. No sonographic Murphy sign noted by sonographer. Common bile duct: Diameter: 3 mm. Liver: No focal lesion identified. Within normal limits in parenchymal echogenicity. Portal vein is patent on color Doppler imaging with normal direction of  blood flow towards the liver. Other: None. IMPRESSION: Unremarkable right upper quadrant ultrasound. Electronically Signed   By: Duanne Guess D.O.   On: 11/13/2022 15:41     Assessment & Plan: Ms. Toi Sebastian is a 27 y.o. black female with depression, asthma and narcolepsy, who was admitted to [facility] on 11/13/2022 for Rhabdomyolysis [M62.82] Elevated LFTs [R79.89] Non-traumatic rhabdomyolysis [M62.82]  Rhabdomyolysis Hematuria and myoglobinuria Proteinuria  ESR and CRP are low, patient most likely with post-strep A myositis. Viral Hepatitis screen negative Negative urine drug screen.   Differential: Post strep A myositis, posts strep A dermatomyositis, autoimmune condition such as SLE, scleroderma/mixed tissue disease or rheumatoid arthritis or Idiopathic Inflammatory Myositis which will need rheumatology consult for.   Pending: ANA - check SPEP/UPEP for amyloidosis, rheumatoid factor, aldolase, LDH.   Continue IV fluids and follow CK levels, LFTs.    LOS: 0 Vian Fluegel 5/14/202412:33 PM

## 2022-11-14 NOTE — Progress Notes (Signed)
On call notification 2225   S:  Reports leg pain unrelieved by Tylenol given at 1317.  B:  58 F medical history of asthma presenting with rhabdomyolysis.  Patient reports being recently diagnosed with strep throat approximately 2 weeks ago.  Patient states she was strep positive at her doctor and placed on a course of penicillin VK.  Patient reports approximately 4 to 5 days into disease course and treatment, she started noticing her urine becoming more dark as well as generalized weakness and fatigue.   A:  A/O x4, VSS, ambulatory with steady gait.  Describes leg pain and cramping, dull throb, discomfort.  Reports minimal relief earlier from Tylenol.  NS 150/hr  R: Additional Tylenol dose?  2235 Response-New orders received. Continue Tylenol and add tramadol and prn flexeril for any possible benefit and monitor for relief.  See [facility]

## 2022-11-15 DIAGNOSIS — M6088 Other myositis, other site: Secondary | ICD-10-CM | POA: Diagnosis not present

## 2022-11-15 DIAGNOSIS — D72829 Elevated white blood cell count, unspecified: Secondary | ICD-10-CM | POA: Diagnosis not present

## 2022-11-15 DIAGNOSIS — B1089 Other human herpesvirus infection: Secondary | ICD-10-CM | POA: Diagnosis not present

## 2022-11-15 DIAGNOSIS — B2709 Gammaherpesviral mononucleosis with other complications: Secondary | ICD-10-CM | POA: Diagnosis not present

## 2022-11-15 DIAGNOSIS — M79661 Pain in right lower leg: Secondary | ICD-10-CM | POA: Diagnosis not present

## 2022-11-15 DIAGNOSIS — Z1152 Encounter for screening for COVID-19: Secondary | ICD-10-CM | POA: Diagnosis not present

## 2022-11-15 DIAGNOSIS — M6282 Rhabdomyolysis: Secondary | ICD-10-CM | POA: Diagnosis not present

## 2022-11-15 DIAGNOSIS — R7989 Other specified abnormal findings of blood chemistry: Secondary | ICD-10-CM | POA: Diagnosis not present

## 2022-11-15 DIAGNOSIS — M79622 Pain in left upper arm: Secondary | ICD-10-CM | POA: Diagnosis not present

## 2022-11-15 DIAGNOSIS — M79621 Pain in right upper arm: Secondary | ICD-10-CM | POA: Diagnosis not present

## 2022-11-15 LAB — ALDOLASE: Aldolase: 3.8 U/L (ref 3.3–10.3)

## 2022-11-15 LAB — KAPPA/LAMBDA LIGHT CHAINS
Kappa free light chain: 15.3 mg/L (ref 3.3–19.4)
Kappa, lambda light chain ratio: 1.2 (ref 0.26–1.65)
Lambda free light chains: 12.8 mg/L (ref 5.7–26.3)

## 2022-11-15 LAB — ANTISTREPTOLYSIN O TITER: ASO: 39 IU/mL (ref 0.0–200.0)

## 2022-11-15 LAB — CK: Total CK: >50000 U/L — ABNORMAL HIGH (ref 38–234)

## 2022-11-15 MED ORDER — PREDNISONE 50 MG PO TABS: 60.0000 mg | ORAL_TABLET | Freq: Every day | ORAL | Status: DC

## 2022-11-15 MED ORDER — PREDNISONE 20 MG PO TABS: 60.0000 mg | ORAL_TABLET | Freq: Every day | ORAL | 0 refills | Status: DC

## 2022-11-15 NOTE — Progress Notes (Signed)
[facility]  ROUNDING NOTE   Subjective:   Boyfriend at bedside.  LDH is elevated.   Objective:  Vital signs in last 24 hours:  Temp:  [97.9 F (36.6 C)-98.5 F (36.9 C)] 98.5 F (36.9 C) (05/15 1146) Pulse Rate:  [60-78] 61 (05/15 1146) Resp:  [16-18] 18 (05/15 1146) BP: (92-108)/(57-70) 97/67 (05/15 1146) SpO2:  [100 %] 100 % (05/15 1146)  Weight change:  Filed Weights   11/13/22 1216  Weight: 59.9 kg    Intake/Output: I/O last 3 completed shifts: In: 2450.3 [I.V.:2450.3] Out: -    Intake/Output this shift:  Total I/O In: 760 [P.O.:120; I.V.:640] Out: -   Physical Exam: General: NAD,   Head: Normocephalic, atraumatic. Moist oral mucosal membranes  Eyes: Anicteric, PERRL  Neck: Supple, trachea midline  Lungs:  Clear to auscultation  Heart: Regular rate and rhythm  Abdomen:  Soft, nontender,   Extremities:  no peripheral edema.  Neurologic: Nonfocal, moving all four extremities  Skin: No lesions        Basic Metabolic Panel: Recent Labs  Lab 11/13/22 1225 11/14/22 1053  NA 138 138  K 3.9 3.8  CL 106 108  CO2 23 23  GLUCOSE 112* 93  BUN 12 10  CREATININE 0.57 0.57  CALCIUM 9.0 8.7*  MG  --  1.9  PHOS  --  2.9    Liver Function Tests: Recent Labs  Lab 11/13/22 1225 11/14/22 1053  AST 747* 589*  ALT 155* 132*  ALKPHOS 65 56  BILITOT 1.0 0.9  PROT 7.1 6.2*  ALBUMIN 3.9 3.3*   No results for input(s): "LIPASE", "AMYLASE" in the last 168 hours. No results for input(s): "AMMONIA" in the last 168 hours.  CBC: Recent Labs  Lab 11/13/22 1225 11/14/22 1053  WBC 9.5 9.9  NEUTROABS  --  6.6  HGB 12.3 11.2*  HCT 37.8 35.0*  MCV 88.9 88.2  PLT 307 252    Cardiac Enzymes: Recent Labs  Lab 11/13/22 1225 11/14/22 0021 11/14/22 1053 11/14/22 2258  CKTOTAL >50,000* 50,000* >50,000* >50,000*    BNP: Invalid input(s): "POCBNP"  CBG: No results for input(s): "GLUCAP" in the last 168 hours.  Microbiology: Results  for orders placed or performed in visit on 11/02/22  Novel Coronavirus, NAA ([facility])     Status: None   Collection Time: 11/02/22 12:00 AM   Specimen: Nasopharyngeal(NP) swabs in vial transport medium   Nasopharynge  Previous  Result Value Ref Range Status   SARS-CoV-2, NAA Not Detected Not Detected Final    Comment: This nucleic acid amplification test was developed and its performance characteristics determined by [employer]. Nucleic acid amplification tests include RT-PCR and TMA. This test has not been FDA cleared or approved. This test has been authorized by FDA under an Emergency Use Authorization (EUA). This test is only authorized for the duration of time the declaration that circumstances exist justifying the authorization of the emergency use of in vitro diagnostic tests for detection of SARS-CoV-2 virus and/or diagnosis of COVID-19 infection under section 564(b)(1) of the Act, 21 U.S.C. 161WRU-0(A) (1), unless the authorization is terminated or revoked sooner. When diagnostic testing is negative, the possibility of a false negative result should be considered in the context of a patient's recent exposures and the presence of clinical signs and symptoms consistent with COVID-19. An individual without symptoms of COVID-19 and who is not shedding SARS-CoV-2 virus wo uld expect to have a negative (not detected) result in this assay.  Coagulation Studies: No results for input(s): "LABPROT", "INR" in the last 72 hours.  Urinalysis: Recent Labs    11/13/22 1225  COLORURINE YELLOW  LABSPEC 1.010  PHURINE 6.0  GLUCOSEU NEGATIVE  HGBUR LARGE*  BILIRUBINUR NEGATIVE  KETONESUR NEGATIVE  PROTEINUR 30*  NITRITE NEGATIVE  LEUKOCYTESUR NEGATIVE      Imaging: US Abdomen Limited RUQ (LIVER/GB)  Result Date: 11/13/2022 CLINICAL DATA:  [id number] Transaminitis [id number] EXAM: ULTRASOUND ABDOMEN LIMITED RIGHT UPPER QUADRANT COMPARISON:  None Available. FINDINGS:  Gallbladder: No gallstones or wall thickening visualized. No sonographic Murphy sign noted by sonographer. Common bile duct: Diameter: 3 mm. Liver: No focal lesion identified. Within normal limits in parenchymal echogenicity. Portal vein is patent on color Doppler imaging with normal direction of blood flow towards the liver. Other: None. IMPRESSION: Unremarkable right upper quadrant ultrasound. Electronically Signed   By: Duanne Guess D.O.   On: 11/13/2022 15:41     Medications:     [START ON 11/16/2022] predniSONE  60 mg Oral Q breakfast   acetaminophen **OR** acetaminophen, cyclobenzaprine, ondansetron **OR** ondansetron (ZOFRAN) IV, traMADol  Assessment/ Plan:  [name] is a 27 y.o.  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 Viral Hepatitis screen negative Negative urine drug screen.    Differential: Post strep A myositis, posts strep A dermatomyositis, autoimmune condition such as SLE, scleroderma/mixed tissue disease or rheumatoid arthritis or Idiopathic Inflammatory Myositis.    Pending: ANA, SPEP, aldolase, rheumatoid factor    Encourage PO inatke . Marland Kitchen    LOS: 1 Heather Schmidt 5/15/20241:19 PM

## 2022-11-15 NOTE — Discharge Summary (Signed)
Physician Discharge Summary   Patient: Heather Schmidt MRN: [id number] DOB: 12/11/1995  Admit date:     11/13/2022  Discharge date: 11/15/22  Discharge Physician: Enedina Finner   PCP: [name], DO   Recommendations at discharge:   F/u Dr Gerrie Nordmann on 11/20/2022 at 11 am F/u PCP in 1-2 weeks  Discharge Diagnoses: Principal Problem:   Rhabdomyolysis Active Problems:   Transaminitis   Elevated LFTs  Heather Schmidt is a 27 y.o. female with medical history significant of asthma presenting with rhabdomyolysis.  Patient reports being recently diagnosed with strep throat approximately 2 weeks ago.  Patient states she was strep positive at her doctor and placed on a course of penicillin VK.  Patient reports approximately 4 to 5 days into disease course and treatment, she started noticing her urine becoming more dark as well as generalized weakness and fatigue.    Acute rhabdomyolysis unclear etiology/? Myositis -- patient came in with generalized muscle weakness more so in the proximal muscle joints and fatigue for 1 to 2 weeks -- patient just completed antibiotic for group a strep pharyngitis with penicillin V -- CK 50,000 x3 -- nephrology consultation with Dr. Wynelle Link-- workup for myositis initiated. -- ESR 30 --LDH 1300 --plt 307 -- CRP within normal limits -- ASO titer negative -- Aldolase, SPEP, Kappa/Lambda chains pending --ANA reflex negative --case d/w Dr Gerrie Nordmann (rheumatology)--rec start prednisone 60 mg qd and f/u 11/20/22 at 11 am --d/w pt and aware of rx plan and f/u --d/c home    Transaminitis -- LFTs trending down -- ultrasound liver negative -- acute hepatitis panel nonreactive     Family communication :pt will relate information to her parents Consults : nephrology. Rheumatology (on the phone) CODE STATUS: full DVT Prophylaxis : ambulation     Disposition: home Diet recommendation:  Discharge Diet Orders (From admission, onward)      Start     Ordered   11/15/22 0000  Diet - low sodium heart healthy        11/15/22 0922   11/15/22 0000  Diet general        11/15/22 0922           Regular diet DISCHARGE MEDICATION: Allergies as of 11/15/2022       Reactions   Fish Allergy Anaphylaxis   Peanut-containing Drug Products Anaphylaxis   Shellfish Allergy Anaphylaxis   Egg [egg-derived Products] Nausea And Vomiting   Other Cough   Ginger and barley   Latex Rash        Medication List     STOP taking these medications    benzonatate 100 MG capsule Commonly known as: TESSALON   valACYclovir 500 MG tablet Commonly known as: Valtrex       TAKE these medications    albuterol 108 (90 Base) MCG/ACT inhaler Commonly known as: VENTOLIN HFA Inhale 2 puffs into the lungs every 6 (six) hours as needed for wheezing.   predniSONE 20 MG tablet Commonly known as: DELTASONE Take 3 tablets (60 mg total) by mouth daily with breakfast. Start taking on: Nov 16, 2022        Follow-up Information     [name], DO. Schedule an appointment as soon as possible for a visit in 1 week(s).   Specialty: Internal Medicine Why: hospital f/u Contact information: [address] [place] [address] [phone]         Patterson Hammersmith, MD. Go on 11/20/2022.   Specialty: Rheumatology Why: at  11 am Contact information: [address] [place] [address] [phone]                 Filed Weights   11/13/22 1216  Weight: 59.9 kg     Condition at discharge: good  The results of significant diagnostics from this hospitalization (including imaging, microbiology, ancillary and laboratory) are listed below for reference.   Imaging Studies: US Abdomen Limited RUQ (LIVER/GB)  Result Date: 11/13/2022 CLINICAL DATA:  [id number] Transaminitis [id number] EXAM: ULTRASOUND ABDOMEN LIMITED RIGHT UPPER QUADRANT COMPARISON:  None Available. FINDINGS: Gallbladder: No gallstones or  wall thickening visualized. No sonographic Murphy sign noted by sonographer. Common bile duct: Diameter: 3 mm. Liver: No focal lesion identified. Within normal limits in parenchymal echogenicity. Portal vein is patent on color Doppler imaging with normal direction of blood flow towards the liver. Other: None. IMPRESSION: Unremarkable right upper quadrant ultrasound. Electronically Signed   By: Duanne Guess D.O.   On: 11/13/2022 15:41    Microbiology: Results for orders placed or performed in visit on 11/02/22  Novel Coronavirus, NAA ([facility])     Status: None   Collection Time: 11/02/22 12:00 AM   Specimen: Nasopharyngeal(NP) swabs in vial transport medium   Nasopharynge  Previous  Result Value Ref Range Status   SARS-CoV-2, NAA Not Detected Not Detected Final    Comment: This nucleic acid amplification test was developed and its performance characteristics determined by [employer]. Nucleic acid amplification tests include RT-PCR and TMA. This test has not been FDA cleared or approved. This test has been authorized by FDA under an Emergency Use Authorization (EUA). This test is only authorized for the duration of time the declaration that circumstances exist justifying the authorization of the emergency use of in vitro diagnostic tests for detection of SARS-CoV-2 virus and/or diagnosis of COVID-19 infection under section 564(b)(1) of the Act, 21 U.S.C. 086VHQ-4(O) (1), unless the authorization is terminated or revoked sooner. When diagnostic testing is negative, the possibility of a false negative result should be considered in the context of a patient's recent exposures and the presence of clinical signs and symptoms consistent with COVID-19. An individual without symptoms of COVID-19 and who is not shedding SARS-CoV-2 virus wo uld expect to have a negative (not detected) result in this assay.     Labs: CBC: Recent Labs  Lab 11/13/22 1225 11/14/22 1053  WBC 9.5 9.9   NEUTROABS  --  6.6  HGB 12.3 11.2*  HCT 37.8 35.0*  MCV 88.9 88.2  PLT 307 252   Basic Metabolic Panel: Recent Labs  Lab 11/13/22 1225 11/14/22 1053  NA 138 138  K 3.9 3.8  CL 106 108  CO2 23 23  GLUCOSE 112* 93  BUN 12 10  CREATININE 0.57 0.57  CALCIUM 9.0 8.7*  MG  --  1.9  PHOS  --  2.9   Liver Function Tests: Recent Labs  Lab 11/13/22 1225 11/14/22 1053  AST 747* 589*  ALT 155* 132*  ALKPHOS 65 56  BILITOT 1.0 0.9  PROT 7.1 6.2*  ALBUMIN 3.9 3.3*   Discharge time spent: greater than 30 minutes.  Signed: Enedina Finner, MD [facility] 11/15/2022

## 2022-11-15 NOTE — Progress Notes (Signed)
Patient is being discharged home. Discharge papers given and explained to patient. She verbalized understanding. Medications and f/u appointments reviewed. Rx sent electronically to the pharmacy. Patient made aware.

## 2022-11-16 ENCOUNTER — Telehealth: Payer: Self-pay | Admitting: *Deleted

## 2022-11-16 LAB — PROTEIN ELECTROPHORESIS, SERUM
A/G Ratio: 1.1 (ref 0.7–1.7)
Albumin ELP: 3.2 g/dL (ref 2.9–4.4)
Alpha-1-Globulin: 0.3 g/dL (ref 0.0–0.4)
Alpha-2-Globulin: 0.9 g/dL (ref 0.4–1.0)
Beta Globulin: 0.9 g/dL (ref 0.7–1.3)
Gamma Globulin: 0.9 g/dL (ref 0.4–1.8)
Globulin, Total: 3 g/dL (ref 2.2–3.9)
Total Protein ELP: 6.2 g/dL (ref 6.0–8.5)

## 2022-11-16 NOTE — Transitions of Care (Post Inpatient/ED Visit) (Signed)
   11/16/2022  Name: Heather Schmidt MRN: [id number] DOB: 03-21-1996  Today's TOC FU Call Status: Today's TOC FU Call Status:: Successful TOC FU Call Competed TOC FU Call Complete Date: 11/16/22  Transition Care Management Follow-up Telephone Call Date of Discharge: 11/15/22 Discharge Facility: [facility]) Type of Discharge: Inpatient Admission Primary Inpatient Discharge Diagnosis:: Rhabdomyolysis How have you been since you were released from the hospital?: Worse (Patient has been readmitted to [facility])       Gean Maidens BSN RN [facility] [phone]

## 2022-11-21 ENCOUNTER — Telehealth: Payer: Self-pay | Admitting: *Deleted

## 2022-11-21 NOTE — Transitions of Care (Post Inpatient/ED Visit) (Signed)
   11/21/2022  Name: Kascha Lamkins MRN: [id number] DOB: 11-02-1995  Today's TOC FU Call Status: Today's TOC FU Call Status:: Unsuccessul Call (1st Attempt) Unsuccessful Call (1st Attempt) Date: 11/21/22  Attempted to reach the patient regarding the most recent Inpatient/ED visit.  Follow Up Plan: Additional outreach attempts will be made to reach the patient to complete the Transitions of Care (Post Inpatient/ED visit) call.   Gean Maidens BSN RN [facility] [phone]

## 2022-11-24 ENCOUNTER — Inpatient Hospital Stay: Payer: BC Managed Care – PPO | Admitting: Internal Medicine

## 2022-11-29 ENCOUNTER — Encounter: Payer: Self-pay | Admitting: Family Medicine

## 2022-11-29 ENCOUNTER — Other Ambulatory Visit (HOSPITAL_COMMUNITY)
Admission: RE | Admit: 2022-11-29 | Discharge: 2022-11-29 | Disposition: A | Discharge status: Home / Self Care | Payer: BC Managed Care – PPO | Source: Ambulatory Visit | Attending: Family Medicine | Admitting: Family Medicine

## 2022-11-29 ENCOUNTER — Ambulatory Visit (INDEPENDENT_AMBULATORY_CARE_PROVIDER_SITE_OTHER): Payer: BC Managed Care – PPO | Admitting: Family Medicine

## 2022-11-29 VITALS — BP 114/74 | HR 76 | Ht 62.0 in | Wt 129.0 lb

## 2022-11-29 DIAGNOSIS — Z113 Encounter for screening for infections with a predominantly sexual mode of transmission: Secondary | ICD-10-CM | POA: Diagnosis not present

## 2022-11-29 DIAGNOSIS — Z124 Encounter for screening for malignant neoplasm of cervix: Secondary | ICD-10-CM

## 2022-11-29 DIAGNOSIS — Z01419 Encounter for gynecological examination (general) (routine) without abnormal findings: Secondary | ICD-10-CM

## 2022-11-29 NOTE — Progress Notes (Signed)
Patient presents for Annual.  LMP: 5.13.24-5.15.24  Last pap: Date: 11/10/20 Contraception: None Mammogram: Not yet indicated STD Screening: Accepts, And blood work  Flu Vaccine : N/A  CC:  Annual

## 2022-11-29 NOTE — Progress Notes (Signed)
Subjective:     Heather Schmidt is a 27 y.o. female and is here for a comprehensive physical exam. The patient reports problems - recent skipped cycle.  Admitted with rhabdomyolysis recently.  The following portions of the patient's history were reviewed and updated as appropriate: allergies, current medications, past family history, past medical history, past social history, past surgical history, and problem list.  Review of Systems Pertinent items noted in HPI and remainder of comprehensive ROS otherwise negative.   Objective:  Chaperone present for exam   BP 114/74   Pulse 76   Ht 5\' 2"  (1.575 m)   Wt 129 lb (58.5 kg)   LMP 11/13/2022   BMI 23.59 kg/m  General appearance: alert, cooperative, and appears stated age Head: Normocephalic, without obvious abnormality, atraumatic Neck: no adenopathy, supple, symmetrical, trachea midline, and thyroid not enlarged, symmetric, no tenderness/mass/nodules Lungs: clear to auscultation bilaterally Heart: regular rate and rhythm, S1, S2 normal, no murmur, click, rub or gallop Abdomen: soft, non-tender; bowel sounds normal; no masses,  no organomegaly Pelvic: cervix normal in appearance, external genitalia normal, no adnexal masses or tenderness, no cervical motion tenderness, uterus normal size, shape, and consistency, and vagina normal without discharge Extremities: extremities normal, atraumatic, no cyanosis or edema Pulses: 2+ and symmetric Skin: Skin color, texture, turgor normal. No rashes or lesions Lymph nodes: Cervical, supraclavicular, and axillary nodes normal. Neurologic: Grossly normal    Assessment:    Healthy female exam.      Plan:  Screening for malignant neoplasm of cervix - Plan: Cytology - PAP  Encounter for gynecological examination without abnormal finding - declines contraception  Screen for STD (sexually transmitted disease) - Plan: RPR+HBsAg+HCVAb+...  Return in 1 year (on 11/29/2023).    See After  Visit Summary for Counseling Recommendations

## 2022-11-29 NOTE — Progress Notes (Unsigned)
   Established Patient Office Visit  Subjective   Patient ID: Heather Schmidt, female    DOB: 05-25-1996  Age: 27 y.o. MRN: [id number]  No chief complaint on file.   HPI  Patient is here today for hospital follow up.  Discharge Date: 11/15/22 Diagnosis: Rhabdomyolysis, transaminitis, potentially post-strep A myositis  Procedures/tests: see below Consultants: Nephrology, Rheumatology New medications: Prednisone 60 mg Discontinued medications: *** Discharge instructions:  *** Status: {Blank multiple:19196::"better","worse","stable","fluctuating"}  Was seen and treated here for strep throat with penicillin, presented to ER with dark urine and fatigue. CK >50,000, ESR 30, LDH 1300, platelets 307. CRP normal, ASO titer negative, ANA negative. LFT'ss elevated initially, trended down prior to discharge. US liver negative, acute hepatitis panel negative.   {History (Optional):23778}  ROS    Objective:     LMP 11/13/2022  {Vitals History (Optional):23777}  Physical Exam   No results found for any visits on 11/30/22.  {Labs (Optional):23779}  The ASCVD Risk score (Arnett DK, et al., 2019) failed to calculate for the following reasons:   The 2019 ASCVD risk score is only valid for ages 42 to 53    Assessment & Plan:   Problem List Items Addressed This Visit   None   No follow-ups on file.    [name], DO

## 2022-11-29 NOTE — Patient Instructions (Signed)

## 2022-11-30 ENCOUNTER — Ambulatory Visit (INDEPENDENT_AMBULATORY_CARE_PROVIDER_SITE_OTHER): Payer: BC Managed Care – PPO | Admitting: Internal Medicine

## 2022-11-30 ENCOUNTER — Encounter: Payer: Self-pay | Admitting: Internal Medicine

## 2022-11-30 VITALS — BP 108/62 | HR 100 | Temp 98.0°F | Resp 18 | Ht 61.0 in | Wt 129.9 lb

## 2022-11-30 DIAGNOSIS — Z09 Encounter for follow-up examination after completed treatment for conditions other than malignant neoplasm: Secondary | ICD-10-CM | POA: Diagnosis not present

## 2022-11-30 DIAGNOSIS — B2790 Infectious mononucleosis, unspecified without complication: Secondary | ICD-10-CM

## 2022-11-30 DIAGNOSIS — M6282 Rhabdomyolysis: Secondary | ICD-10-CM | POA: Diagnosis not present

## 2022-11-30 DIAGNOSIS — R7401 Elevation of levels of liver transaminase levels: Secondary | ICD-10-CM

## 2022-11-30 DIAGNOSIS — R1012 Left upper quadrant pain: Secondary | ICD-10-CM

## 2022-11-30 LAB — RPR+HBSAG+HCVAB+...
HIV Screen 4th Generation wRfx: NONREACTIVE
Hep C Virus Ab: NONREACTIVE
Hepatitis B Surface Ag: NEGATIVE
RPR Ser Ql: NONREACTIVE

## 2022-11-30 NOTE — Patient Instructions (Signed)
Infectious Mononucleosis Infectious mononucleosis is a viral infection. It is often referred to as "mono." It causes symptoms that affect various areas of the body, including the throat, upper air passages, and lymph glands. The liver or spleen may also be affected. The virus spreads from person to person (is contagious) through close contact. The illness is usually not serious, and it typically goes away in 2-4 weeks without treatment. In rare cases, symptoms can be more severe and last longer, sometimes up to several months. What are the causes? This condition is commonly caused by the Epstein-Barr virus (EBV). This virus spreads through: Having contact with an infected person's saliva or other bodily fluids, often through: Kissing. Sex. Coughing. Sneezing. Sharing utensils or drinking glasses with an infected person. Receiving blood from an infected donor (blood transfusion). Receiving an organ from an infected donor (organ transplant). What increases the risk? You are more likely to develop this condition if: You are 15-24 years old. What are the signs or symptoms?  Symptoms of this condition usually appear 4-6 weeks after infection. Symptoms may develop slowly and occur at different times. Common symptoms include: Mild symptoms of this condition include: Headache. Muscle aches. Swollen glands. Poor appetite. Moderate symptoms of this condition include: Sore throat. Fever. Rash. Nausea Other symptoms may include: Extreme fatigue. Enlarged liver or spleen. Abdominal pain. How is this diagnosed? This condition may be diagnosed based on: Your medical history. Your symptoms. A physical exam. Blood tests to confirm the diagnosis. How is this treated? There is no cure for this condition. Infectious mononucleosis usually goes away on its own with time. Treatment can help relieve symptoms and may include: Drinking plenty of fluids. Getting a lot of rest. Taking medicines to  relieve pain and fever. Medicine (corticosteroids) to reduce swelling. This may be used if swelling in the throat causes breathing or swallowing problems. In some severe cases, treatment may have to be given in a hospital. Follow these instructions at home: Medicines Take over-the-counter and prescription medicines only as told by your health care provider. Do not take the antibiotics ampicillin or amoxicillin. This may cause a rash. If you are under 18, do not take aspirin because of the association with Reye's syndrome. Activity Rest as needed. Do not participate in any of the following activities until your health care provider approves: Exercise that requires a lot of energy. Heavy lifting. Contact sports. You may need to wait at least a month before participating in sports. Gradually resume your normal activities after your fever is gone, or when your health care provider tells you that you can. Be sure to rest when you get tired. General instructions  Avoid kissing or sharing utensils or drinking glasses until your health care provider tells you that you are no longer contagious. Drink enough fluid to keep your urine pale yellow. Do not drink alcohol. If you have a sore throat: Gargle with a salt-water mixture 3-4 times a day or as needed. To make a salt-water mixture, completely dissolve -1 tsp (3-6 g) of salt in 1 cup (237 mL) of warm water. Eat soft foods. Cold foods such as ice cream or ice pops can soothe a sore throat. Try sucking on hard candy or lozenges. Keep all follow-up visits. This is important. How is this prevented?  Avoid contact with people who are infected with mononucleosis. An infected person may not always appear ill, but he or she can still spread the virus. Avoid sharing utensils, drinking glasses, or toothbrushes. Wash your   hands frequently for at least 20 seconds with soap and water. If soap and water are not available, use hand sanitizer. Use the inside  of your elbow to cover your mouth when coughing or sneezing. Where to find more information Centers for Disease Control and Prevention: www.cdc.gov Contact a health care provider if: Your fever is not gone after 10 days. You have swollen lymph nodes that are not back to normal after 4 weeks. Your activity level is not back to normal after 2 months. Your skin or the white parts of your eyes turn yellow (jaundice). You have constipation. You may have constipation if you are having: Fewer bowel movements in a week than normal. Difficulty passing stool. Stools that are dry, hard, or larger than normal. Get help right away if: You cannot stop vomiting. You are drooling or have trouble swallowing. You have signs of dehydration. These may include: Weakness. Pale skin. Sunken eyes or dry mouth. Rapid breathing or pulse. You have trouble breathing. You develop a stiff neck or a severe headache. You have severe pain in your abdomen or shoulder. You are confused or you have trouble with balance. You have jerky movements that you cannot control (seizures). Your nose or gums begin to bleed. Some of these symptoms may represent a serious problem that is an emergency. Do not wait to see if the symptoms will go away. Get medical help right away. Call your local emergency services (911 in the [place] Do not drive yourself to the hospital. Summary Infectious mononucleosis, or "mono," is an infection caused by the Epstein-Barr virus. The virus that causes this condition is spread through bodily fluids. The virus is most commonly spread by kissing or sharing drinks or utensils with an infected person. You are more likely to develop this condition if you are 15-24 years old. Symptoms of this condition include sore throat, headache, fever, swollen glands, muscle aches, and extreme fatigue. There is no cure for this condition. Treatment can help relieve symptoms and may include drinking plenty of fluids,  getting a lot of rest, or taking medicines. This information is not intended to replace advice given to you by your health care provider. Make sure you discuss any questions you have with your health care provider. Document Revised: 06/04/2020 Document Reviewed: 06/04/2020 Elsevier Patient Education  2023 [employer]  

## 2022-12-01 LAB — COMPLETE METABOLIC PANEL WITH GFR
AG Ratio: 1.4 (calc) (ref 1.0–2.5)
ALT: 44 U/L — ABNORMAL HIGH (ref 6–29)
AST: 34 U/L — ABNORMAL HIGH (ref 10–30)
Albumin: 3.9 g/dL (ref 3.6–5.1)
Alkaline phosphatase (APISO): 74 U/L (ref 31–125)
BUN: 12 mg/dL (ref 7–25)
CO2: 25 mmol/L (ref 20–32)
Calcium: 9.2 mg/dL (ref 8.6–10.2)
Chloride: 105 mmol/L (ref 98–110)
Creat: 0.59 mg/dL (ref 0.50–0.96)
Globulin: 2.7 g/dL (calc) (ref 1.9–3.7)
Glucose, Bld: 84 mg/dL (ref 65–99)
Potassium: 4 mmol/L (ref 3.5–5.3)
Sodium: 138 mmol/L (ref 135–146)
Total Bilirubin: 0.7 mg/dL (ref 0.2–1.2)
Total Protein: 6.6 g/dL (ref 6.1–8.1)
eGFR: 129 mL/min/{1.73_m2} (ref >=60)

## 2022-12-01 LAB — CK: Total CK: 644 U/L — ABNORMAL HIGH (ref 29–143)

## 2022-12-01 NOTE — Addendum Note (Signed)
Addended by: [name] on: 12/01/2022 01:34 PM   Modules accepted: Level of Service

## 2022-12-04 ENCOUNTER — Encounter: Payer: Self-pay | Admitting: Internal Medicine

## 2022-12-04 ENCOUNTER — Encounter: Payer: Self-pay | Admitting: Family Medicine

## 2022-12-04 ENCOUNTER — Ambulatory Visit (INDEPENDENT_AMBULATORY_CARE_PROVIDER_SITE_OTHER): Payer: BC Managed Care – PPO | Admitting: Family Medicine

## 2022-12-04 ENCOUNTER — Other Ambulatory Visit
Admission: RE | Admit: 2022-12-04 | Discharge: 2022-12-04 | Disposition: A | Discharge status: Home / Self Care | Payer: BC Managed Care – PPO | Source: Ambulatory Visit | Attending: Family Medicine | Admitting: Family Medicine

## 2022-12-04 VITALS — BP 116/68 | HR 95 | Temp 98.2°F | Resp 12 | Ht 62.0 in | Wt 129.7 lb

## 2022-12-04 DIAGNOSIS — Z8739 Personal history of other diseases of the musculoskeletal system and connective tissue: Secondary | ICD-10-CM | POA: Insufficient documentation

## 2022-12-04 DIAGNOSIS — J029 Acute pharyngitis, unspecified: Secondary | ICD-10-CM | POA: Insufficient documentation

## 2022-12-04 DIAGNOSIS — J069 Acute upper respiratory infection, unspecified: Secondary | ICD-10-CM | POA: Diagnosis not present

## 2022-12-04 LAB — COMPREHENSIVE METABOLIC PANEL
ALT: 33 U/L (ref 0–44)
AST: 29 U/L (ref 15–41)
Albumin: 4.4 g/dL (ref 3.5–5.0)
Alkaline Phosphatase: 77 U/L (ref 38–126)
Anion gap: 8 (ref 5–15)
BUN: 11 mg/dL (ref 6–20)
CO2: 23 mmol/L (ref 22–32)
Calcium: 9 mg/dL (ref 8.9–10.3)
Chloride: 106 mmol/L (ref 98–111)
Creatinine, Ser: 0.72 mg/dL (ref 0.44–1.00)
GFR, Estimated: >60 mL/min (ref >60)
Glucose, Bld: 87 mg/dL (ref 70–99)
Potassium: 3.6 mmol/L (ref 3.5–5.1)
Sodium: 137 mmol/L (ref 135–145)
Total Bilirubin: 1.7 mg/dL — ABNORMAL HIGH (ref 0.3–1.2)
Total Protein: 7.6 g/dL (ref 6.5–8.1)

## 2022-12-04 LAB — CBC WITH DIFFERENTIAL/PLATELET
Abs Immature Granulocytes: 0.03 10*3/uL (ref 0.00–0.07)
Basophils Absolute: 0.1 10*3/uL (ref 0.0–0.1)
Basophils Relative: 1 %
Eosinophils Absolute: 1 10*3/uL — ABNORMAL HIGH (ref 0.0–0.5)
Eosinophils Relative: 8 %
HCT: 38.2 % (ref 36.0–46.0)
Hemoglobin: 12.2 g/dL (ref 12.0–15.0)
Immature Granulocytes: 0 %
Lymphocytes Relative: 21 %
Lymphs Abs: 2.7 10*3/uL (ref 0.7–4.0)
MCH: 28.4 pg (ref 26.0–34.0)
MCHC: 31.9 g/dL (ref 30.0–36.0)
MCV: 88.8 fL (ref 80.0–100.0)
Monocytes Absolute: 1.1 10*3/uL — ABNORMAL HIGH (ref 0.1–1.0)
Monocytes Relative: 8 %
Neutro Abs: 8 10*3/uL — ABNORMAL HIGH (ref 1.7–7.7)
Neutrophils Relative %: 62 %
Platelets: 247 10*3/uL (ref 150–400)
RBC: 4.3 MIL/uL (ref 3.87–5.11)
RDW: 13 % (ref 11.5–15.5)
WBC: 12.8 10*3/uL — ABNORMAL HIGH (ref 4.0–10.5)
nRBC: 0 % (ref 0.0–0.2)

## 2022-12-04 LAB — CK: Total CK: 362 U/L — ABNORMAL HIGH (ref 38–234)

## 2022-12-04 MED ORDER — ALUM & MAG HYDROXIDE-SIMETH 200-200-20 MG/5ML PO SUSP
5.0000 mL | Freq: Four times a day (QID) | ORAL | 0 refills | Status: DC | PRN
Start: 2022-12-04 — End: 2023-01-16

## 2022-12-04 NOTE — Progress Notes (Signed)
Name: Rosabelle Jagers   MRN: [id number]    DOB: [birth year]   Date:12/04/2022       Progress Note  Subjective  Chief Complaint  Sore Throat/ Cough  HPI  URI: she has developed headache, dry cough, sore throat, rhinorrhea last night. Legs feels tired again her calves just like when she had Rhabdomyolysis  She also feels fatigued . She has been able to eat. 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   Eczema 03/31/2020   Generalized anxiety disorder 12/05/2019   Abnormal cervical Papanicolaou smear 10/31/2019   Vitamin D insufficiency 10/31/2019   Nonintractable episodic headache 10/31/2019   Depression 10/31/2019   History of anaphylaxis 10/31/2019   Narcolepsy 10/08/2019    Past Surgical History:  Procedure Laterality Date   WISDOM TOOTH EXTRACTION      Family History  Problem Relation Age of Onset   Asthma Mother    Thyroid disease Mother    Asthma Father    Cancer Maternal Grandmother    COPD Maternal Grandmother    Hypertension Paternal Grandmother    Hypertension Paternal Grandfather     Social History   Tobacco Use   Smoking status: Never   Smokeless tobacco: Never  Substance Use Topics   Alcohol use: Yes    Comment: rarely     Current Outpatient Medications:    albuterol (VENTOLIN HFA) 108 (90 Base) MCG/ACT inhaler, Inhale 2 puffs into the lungs every 6 (six) hours as needed for wheezing., Disp: 1 each, Rfl: 1   magic mouthwash (lidocaine, diphenhydrAMINE, alum & mag hydroxide) suspension, Swish and spit 5 mLs 4 (four) times daily as needed for mouth pain., Disp: 360 mL, Rfl:  0  Allergies  Allergen Reactions   Fish Allergy Anaphylaxis   Peanut-Containing Drug Products Anaphylaxis   Shellfish Allergy Anaphylaxis   Egg [Egg-Derived Products] Nausea And Vomiting   Other Cough    Ginger and barley   Latex Rash    I personally reviewed active problem list, medication list, allergies, family history, social history, health maintenance with the patient/caregiver today.   ROS  Ten systems reviewed and is negative except as mentioned in HPI   Objective  Vitals:   12/04/22 1355  BP: 116/68  Pulse: 95  Resp: 12  Temp: 98.2 F (36.8 C)  TempSrc: Oral  SpO2: 100%  Weight: 129 lb 11.2 oz (58.8 kg)  Height: 5\' 2"  (1.575 m)    Body mass index is 23.72 kg/m.  Physical Exam  Constitutional: Patient appears well-developed and well-nourished.  No distress.  HEENT: head atraumatic, normocephalic, pupils equal and reactive to light, ears normal TM, clear rhinorrhea, normal throat exam , neck supple Cardiovascular: Normal rate, regular rhythm and normal heart sounds.  No murmur heard. No BLE edema. Pulmonary/Chest: Effort normal and breath sounds normal. No respiratory distress. Abdominal: Soft.  There is no tenderness. Psychiatric: Patient has a normal mood and affect. behavior is normal. Judgment and thought content normal.  Muscular skeletal: calves feels sore with palpation, no edema    PHQ2/9:    12/04/2022    1:57 PM 11/30/2022    9:07 AM  11/29/2022    2:54 PM 11/02/2022    2:21 PM 09/26/2022    9:00 AM  Depression screen PHQ 2/9  Decreased Interest 0 0 1 0 0  Down, Depressed, Hopeless 1 1 1  0 0  PHQ - 2 Score 1 1 2  0 0  Altered sleeping 0 0 3 0 0  Tired, decreased energy 2 0 2 0 0  Change in appetite 2 0 2 0 0  Feeling bad or failure about yourself  1 0 1 0 0  Trouble concentrating 3 0 3 0 0  Moving slowly or fidgety/restless 1 0 1 0 0  Suicidal thoughts 0 0 0 0 0  PHQ-9 Score 10 1 14  0 0  Difficult doing work/chores Somewhat difficult Not  difficult at all  Not difficult at all Not difficult at all    phq 9 is positive   Fall Risk:    12/04/2022    1:56 PM 11/30/2022    9:03 AM 11/02/2022    2:15 PM 09/26/2022    9:00 AM 05/16/2022    1:05 PM  Fall Risk   Falls in the past year? 0 0 0 0 0  Number falls in past yr:  0 0 0 0  Injury with Fall?  0 0 0 0  Risk for fall due to : No Fall Risks      Follow up Falls prevention discussed         Functional Status Survey: Is the patient deaf or have difficulty hearing?: No Does the patient have difficulty seeing, even when wearing glasses/contacts?: No Does the patient have difficulty concentrating, remembering, or making decisions?: Yes Does the patient have difficulty walking or climbing stairs?: No Does the patient have difficulty dressing or bathing?: No Does the patient have difficulty doing errands alone such as visiting a doctor's office or shopping?: No    Assessment & Plan  1. History of rhabdomyolysis  - CBC with Differential/Platelet; Future - CK (Creatine Kinase); Future - Comprehensive metabolic panel; Future - EBV ab to viral capsid ag pnl, IgG+IgM; Future  2. Sore throat  - CBC with Differential/Platelet; Future - CK (Creatine Kinase); Future - Comprehensive metabolic panel; Future - EBV ab to viral capsid ag pnl, IgG+IgM; Future - magic mouthwash (lidocaine, diphenhydrAMINE, alum & mag hydroxide) suspension; Swish and spit 5 mLs 4 (four) times daily as needed for mouth pain.  Dispense: 360 mL; Refill: 0  3. Viral URI  Rest, fluids, go to [facility] if increase in muscle aches

## 2022-12-05 ENCOUNTER — Encounter: Payer: Self-pay | Admitting: Family Medicine

## 2022-12-05 ENCOUNTER — Other Ambulatory Visit: Payer: Self-pay | Admitting: Family Medicine

## 2022-12-05 ENCOUNTER — Ambulatory Visit
Admission: RE | Admit: 2022-12-05 | Discharge: 2022-12-05 | Disposition: A | Discharge status: Home / Self Care | Payer: BC Managed Care – PPO | Attending: Family Medicine | Admitting: Family Medicine

## 2022-12-05 ENCOUNTER — Ambulatory Visit
Admission: RE | Admit: 2022-12-05 | Discharge: 2022-12-05 | Disposition: A | Discharge status: Home / Self Care | Payer: BC Managed Care – PPO | Source: Ambulatory Visit | Attending: Family Medicine | Admitting: Family Medicine

## 2022-12-05 DIAGNOSIS — D72829 Elevated white blood cell count, unspecified: Secondary | ICD-10-CM | POA: Diagnosis not present

## 2022-12-05 DIAGNOSIS — R051 Acute cough: Secondary | ICD-10-CM | POA: Insufficient documentation

## 2022-12-05 DIAGNOSIS — R059 Cough, unspecified: Secondary | ICD-10-CM | POA: Diagnosis not present

## 2022-12-05 LAB — CYTOLOGY - PAP
Chlamydia: NEGATIVE
Comment: NEGATIVE
Comment: NEGATIVE
Comment: NORMAL
Neisseria Gonorrhea: NEGATIVE
Trichomonas: NEGATIVE

## 2022-12-05 LAB — EBV AB TO VIRAL CAPSID AG PNL, IGG+IGM
EBV VCA IgG: >600 U/mL — ABNORMAL HIGH (ref 0.0–17.9)
EBV VCA IgM: 75.4 U/mL — ABNORMAL HIGH (ref 0.0–35.9)

## 2022-12-06 ENCOUNTER — Other Ambulatory Visit
Admission: RE | Admit: 2022-12-06 | Discharge: 2022-12-06 | Disposition: A | Discharge status: Home / Self Care | Payer: BC Managed Care – PPO | Attending: Family Medicine | Admitting: Family Medicine

## 2022-12-06 ENCOUNTER — Encounter: Payer: Self-pay | Admitting: Family Medicine

## 2022-12-06 ENCOUNTER — Ambulatory Visit (INDEPENDENT_AMBULATORY_CARE_PROVIDER_SITE_OTHER): Payer: BC Managed Care – PPO | Admitting: Family Medicine

## 2022-12-06 VITALS — BP 106/70 | HR 96 | Temp 97.9°F | Resp 12 | Ht 62.0 in | Wt 129.3 lb

## 2022-12-06 DIAGNOSIS — D72829 Elevated white blood cell count, unspecified: Secondary | ICD-10-CM

## 2022-12-06 DIAGNOSIS — B2790 Infectious mononucleosis, unspecified without complication: Secondary | ICD-10-CM | POA: Diagnosis not present

## 2022-12-06 DIAGNOSIS — J4521 Mild intermittent asthma with (acute) exacerbation: Secondary | ICD-10-CM

## 2022-12-06 LAB — CBC WITH DIFFERENTIAL/PLATELET
Abs Immature Granulocytes: 0.03 10*3/uL (ref 0.00–0.07)
Basophils Absolute: 0.1 10*3/uL (ref 0.0–0.1)
Basophils Relative: 1 %
Eosinophils Absolute: 1.1 10*3/uL — ABNORMAL HIGH (ref 0.0–0.5)
Eosinophils Relative: 9 %
HCT: 35.5 % — ABNORMAL LOW (ref 36.0–46.0)
Hemoglobin: 11.6 g/dL — ABNORMAL LOW (ref 12.0–15.0)
Immature Granulocytes: 0 %
Lymphocytes Relative: 30 %
Lymphs Abs: 3.8 10*3/uL (ref 0.7–4.0)
MCH: 28.8 pg (ref 26.0–34.0)
MCHC: 32.7 g/dL (ref 30.0–36.0)
MCV: 88.1 fL (ref 80.0–100.0)
Monocytes Absolute: 0.9 10*3/uL (ref 0.1–1.0)
Monocytes Relative: 7 %
Neutro Abs: 6.8 10*3/uL (ref 1.7–7.7)
Neutrophils Relative %: 53 %
Platelets: 232 10*3/uL (ref 150–400)
RBC: 4.03 MIL/uL (ref 3.87–5.11)
RDW: 13.1 % (ref 11.5–15.5)
WBC: 12.7 10*3/uL — ABNORMAL HIGH (ref 4.0–10.5)
nRBC: 0 % (ref 0.0–0.2)

## 2022-12-06 MED ORDER — BUDESONIDE-FORMOTEROL FUMARATE 160-4.5 MCG/ACT IN AERO
2.0000 | INHALATION_SPRAY | Freq: Two times a day (BID) | RESPIRATORY_TRACT | 1 refills | Status: DC
Start: 2022-12-06 — End: 2023-01-16

## 2022-12-06 NOTE — Progress Notes (Deleted)
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   Eczema 03/31/2020   Generalized anxiety disorder 12/05/2019   Abnormal cervical Papanicolaou smear 10/31/2019   Vitamin D insufficiency 10/31/2019   Nonintractable episodic headache 10/31/2019   Depression 10/31/2019   History of anaphylaxis 10/31/2019   Narcolepsy 10/08/2019    Past Surgical History:  Procedure Laterality Date   WISDOM TOOTH EXTRACTION      Family History  Problem Relation Age of Onset   Asthma Mother    Thyroid disease Mother    Asthma Father    Cancer Maternal Grandmother    COPD Maternal Grandmother    Hypertension Paternal Grandmother    Hypertension Paternal Grandfather     Social History   Tobacco Use   Smoking status: Never   Smokeless tobacco: Never  Substance Use Topics   Alcohol use: Yes    Comment: rarely     Current Outpatient Medications:    albuterol (VENTOLIN HFA) 108 (90 Base) MCG/ACT inhaler, Inhale 2 puffs into the lungs every 6 (six) hours as needed for wheezing., Disp: 1 each, Rfl: 1   magic mouthwash (lidocaine, diphenhydrAMINE, alum & mag hydroxide) suspension, Swish and spit 5 mLs 4 (four) times daily as needed for mouth pain., Disp: 360 mL, Rfl: 0  Allergies  Allergen Reactions   Fish Allergy Anaphylaxis   Peanut-Containing Drug Products Anaphylaxis   Shellfish Allergy Anaphylaxis   Egg [Egg-Derived Products] Nausea And Vomiting   Other Cough    Ginger and barley   Latex Rash    I personally reviewed {Reviewed:14835} with  the patient/caregiver today.   ROS  ***  Objective  Vitals:   12/06/22 1213  BP: 106/70  Pulse: 96  Resp: 12  Temp: 97.9 F (36.6 C)  TempSrc: Oral  SpO2: 97%  Weight: 129 lb 4.8 oz (58.7 kg)  Height: 5\' 2"  (1.575 m)    Body mass index is 23.65 kg/m.  Physical Exam ***  Recent Results (from the past 2160 hour(s))  Novel Coronavirus, NAA ([facility])     Status: None   Collection Time: 09/26/22 12:00 AM   Specimen: Nasopharyngeal(NP) swabs in vial transport medium   Nasopharynge  Previous  Result Value Ref Range   SARS-CoV-2, NAA Not Detected Not Detected    Comment: This nucleic acid amplification test was developed and its performance characteristics determined by [employer]. Nucleic acid amplification tests include RT-PCR and TMA. This test has not been FDA cleared or approved. This test has been authorized by FDA under an Emergency Use Authorization (EUA). This test is only authorized for the duration of time the declaration that circumstances exist justifying the authorization of the emergency use of in vitro diagnostic tests for detection of SARS-CoV-2 virus and/or diagnosis of COVID-19 infection under section 564(b)(1) of the Act, 21 U.S.C. 409WJX-9(J) (1), unless the authorization is terminated or revoked sooner. When diagnostic testing is negative, the possibility of a false negative result should be considered in the context of a patient's recent exposures  and the presence of clinical signs and symptoms consistent with COVID-19. An individual without symptoms of COVID-19 and who is not shedding SARS-CoV-2 virus wo uld expect to have a negative (not detected) result in this assay.   Specimen status report     Status: None   Collection Time: 09/26/22 12:00 AM  Result Value Ref Range   specimen status report Comment     Comment: Please note Please note The date and/or time of collection was not indicated on the requisition as required by state  and federal law.  The date of receipt of the specimen was used as the collection date if not supplied.   POCT rapid strep A     Status: None   Collection Time: 09/26/22  9:02 AM  Result Value Ref Range   Rapid Strep A Screen Negative Negative  Novel Coronavirus, NAA ([facility])     Status: None   Collection Time: 11/02/22 12:00 AM   Specimen: Nasopharyngeal(NP) swabs in vial transport medium   Nasopharynge  Previous  Result Value Ref Range   SARS-CoV-2, NAA Not Detected Not Detected    Comment: This nucleic acid amplification test was developed and its performance characteristics determined by [employer]. Nucleic acid amplification tests include RT-PCR and TMA. This test has not been FDA cleared or approved. This test has been authorized by FDA under an Emergency Use Authorization (EUA). This test is only authorized for the duration of time the declaration that circumstances exist justifying the authorization of the emergency use of in vitro diagnostic tests for detection of SARS-CoV-2 virus and/or diagnosis of COVID-19 infection under section 564(b)(1) of the Act, 21 U.S.C. 161WRU-0(A) (1), unless the authorization is terminated or revoked sooner. When diagnostic testing is negative, the possibility of a false negative result should be considered in the context of a patient's recent exposures and the presence of clinical signs and symptoms consistent with COVID-19. An individual without symptoms of COVID-19 and who is not shedding SARS-CoV-2 virus wo uld expect to have a negative (not detected) result in this assay.   POCT rapid strep A     Status: Abnormal   Collection Time: 11/02/22  2:23 PM  Result Value Ref Range   Rapid Strep A Screen Positive (A) Negative  CBC     Status: None   Collection Time: 11/13/22 12:25 PM  Result Value Ref Range   WBC 9.5 4.0 - 10.5 K/uL   RBC 4.25 3.87 - 5.11 MIL/uL   Hemoglobin 12.3 12.0 - 15.0 g/dL   HCT 54.0 98.1 - 19.1 %   MCV  88.9 80.0 - 100.0 fL   MCH 28.9 26.0 - 34.0 pg   MCHC 32.5 30.0 - 36.0 g/dL   RDW 47.8 29.5 - 62.1 %   Platelets 307 150 - 400 K/uL   nRBC 0.0 0.0 - 0.2 %    Comment: Performed at [facility], [address] [place], [place] [address]  Basic metabolic panel     Status: Abnormal   Collection Time: 11/13/22 12:25 PM  Result Value Ref Range   Sodium 138 135 - 145 mmol/L   Potassium 3.9 3.5 - 5.1 mmol/L   Chloride 106 98 - 111 mmol/L   CO2 23 22 - 32 mmol/L   Glucose, Bld 112 (H) 70 - 99 mg/dL    Comment: Glucose reference range applies only to samples taken after fasting for at least 8 hours.   BUN 12 6 - 20 mg/dL   Creatinine, Ser  0.57 0.44 - 1.00 mg/dL   Calcium 9.0 8.9 - 16.1 mg/dL   GFR, Estimated >09 >60 mL/min    Comment: (NOTE) Calculated using the CKD-EPI Creatinine Equation (2021)    Anion gap 9 5 - 15    Comment: Performed at [facility], [address] [place], [place] [address]  Urinalysis, Routine w reflex microscopic -Urine, Clean Catch     Status: Abnormal   Collection Time: 11/13/22 12:25 PM  Result Value Ref Range   Color, Urine YELLOW YELLOW   APPearance CLEAR (A) CLEAR   Specific Gravity, Urine 1.010 1.005 - 1.030   pH 6.0 5.0 - 8.0   Glucose, UA NEGATIVE NEGATIVE mg/dL   Hgb urine dipstick LARGE (A) NEGATIVE   Bilirubin Urine NEGATIVE NEGATIVE   Ketones, ur NEGATIVE NEGATIVE mg/dL   Protein, ur 30 (A) NEGATIVE mg/dL   Nitrite NEGATIVE NEGATIVE   Leukocytes,Ua NEGATIVE NEGATIVE   RBC / HPF 0-5 0 - 5 RBC/hpf   WBC, UA 0-5 0 - 5 WBC/hpf   Bacteria, UA NONE SEEN NONE SEEN   Squamous Epithelial / HPF 0-5 0 - 5 /HPF   Mucus PRESENT     Comment: Performed at [facility], [address] [place], [place] [address]  CK     Status: Abnormal   Collection Time: 11/13/22 12:25 PM  Result Value Ref Range   Total CK >50,000 (H) 38 - 234 U/L    Comment: RESULT CONFIRMED BY MANUAL DILUTION MW Performed at [facility], [address] [place], [place] [address]   Hepatic function panel     Status: Abnormal   Collection Time: 11/13/22 12:25 PM  Result Value Ref Range   Total Protein 7.1 6.5 - 8.1 g/dL   Albumin 3.9 3.5 - 5.0 g/dL   AST 562 (H) 15 - 41 U/L   ALT 155 (H) 0 - 44 U/L   Alkaline Phosphatase 65 38 - 126 U/L   Total Bilirubin 1.0 0.3 - 1.2 mg/dL   Bilirubin, Direct 0.1 0.0 - 0.2 mg/dL   Indirect Bilirubin 0.9 0.3 - 0.9 mg/dL    Comment: Performed at [facility], [address] [place], [place] [address]  Sedimentation rate     Status: Abnormal   Collection Time: 11/13/22  3:17 PM  Result Value Ref Range   Sed Rate 30 (H) 0 - 20 mm/hr    Comment: Performed at [facility], [address] [place], [place] [address]  Hepatitis panel, acute     Status: None   Collection Time: 11/13/22  3:25 PM  Result Value Ref Range   Hepatitis B Surface Ag NON REACTIVE NON REACTIVE   HCV Ab NON REACTIVE NON REACTIVE    Comment: (NOTE) Nonreactive HCV antibody screen is consistent with no HCV infections,  unless recent infection is suspected or other evidence exists to indicate HCV infection.     Hep A IgM NON REACTIVE NON REACTIVE   Hep B C IgM NON REACTIVE NON REACTIVE    Comment: Performed at [facility], [address] [place], [place] [address]  Urine Drug Screen, Qualitative ([facility] only)     Status: None   Collection Time: 11/13/22  3:25 PM  Result Value Ref Range   Tricyclic, Ur Screen NONE DETECTED NONE DETECTED   Amphetamines, Ur Screen NONE DETECTED NONE DETECTED   MDMA (Ecstasy)Ur Screen NONE DETECTED NONE DETECTED   Cocaine Metabolite,Ur Oakes NONE DETECTED NONE DETECTED  Opiate, Ur Screen NONE DETECTED NONE DETECTED   Phencyclidine (PCP) Ur S NONE DETECTED NONE DETECTED   Cannabinoid 50 Ng, Ur [place] NONE DETECTED NONE DETECTED   Barbiturates, Ur Screen NONE DETECTED NONE DETECTED   Benzodiazepine, Ur Scrn NONE DETECTED NONE DETECTED   Methadone Scn, Ur NONE  DETECTED NONE DETECTED    Comment: (NOTE) Tricyclics + metabolites, urine    Cutoff 1000 ng/mL Amphetamines + metabolites, urine  Cutoff 1000 ng/mL MDMA (Ecstasy), urine              Cutoff 500 ng/mL Cocaine Metabolite, urine          Cutoff 300 ng/mL Opiate + metabolites, urine        Cutoff 300 ng/mL Phencyclidine (PCP), urine         Cutoff 25 ng/mL Cannabinoid, urine                 Cutoff 50 ng/mL Barbiturates + metabolites, urine  Cutoff 200 ng/mL Benzodiazepine, urine              Cutoff 200 ng/mL Methadone, urine                   Cutoff 300 ng/mL  The urine drug screen provides only a preliminary, unconfirmed analytical test result and should not be used for non-medical purposes. Clinical consideration and professional judgment should be applied to any positive drug screen result due to possible interfering substances. A more specific alternate chemical method must be used in order to obtain a confirmed analytical result. Gas chromatography / mass spectrometry (GC/MS) is the preferred confirm atory method. Performed at [facility], [address] [place], [place] [address]   ANA w/Reflex     Status: None   Collection Time: 11/13/22  3:25 PM  Result Value Ref Range   Anti Nuclear Antibody (ANA) Negative Negative    Comment: (NOTE) Performed At: [facility] [address] [place], [place] [id number] Jolene Schimke MD XB:[id number]   C-reactive protein     Status: None   Collection Time: 11/13/22  3:25 PM  Result Value Ref Range   CRP 0.9 <1.0 mg/dL    Comment: Performed at [facility], [address] [place], [place] [address]  TSH     Status: None   Collection Time: 11/13/22  3:25 PM  Result Value Ref Range   TSH 1.410 0.350 - 4.500 uIU/mL    Comment: Performed by a 3rd Generation assay with a functional sensitivity of <=0.01 uIU/mL. Performed at [facility], [address] [place], [place] [address]   Acetaminophen level      Status: None   Collection Time: 11/13/22  5:47 PM  Result Value Ref Range   Acetaminophen (Tylenol), Serum 12 10 - 30 ug/mL    Comment: (NOTE) Therapeutic concentrations vary significantly. A range of 10-30 ug/mL  may be an effective concentration for many patients. However, some  are best treated at concentrations outside of this range. Acetaminophen concentrations >150 ug/mL at 4 hours after ingestion  and >50 ug/mL at 12 hours after ingestion are often associated with  toxic reactions.  Performed at [facility], [address] [place], [place] [address]   CK     Status: Abnormal   Collection Time: 11/14/22 12:21 AM  Result Value Ref Range   Total CK 50,000 (H) 38 - 234 U/L    Comment: RESULT CONFIRMED BY MANUAL DILUTION SKL Performed at [facility],  [address] [place], [place] [address]   CK     Status: Abnormal   Collection Time: 11/14/22 10:53 AM  Result Value Ref Range   Total CK >50,000 (H) 38 - 234 U/L    Comment: RESULT CONFIRMED BY MANUAL DILUTION MU Performed at [facility], [address] [place], [place] [address]   Comprehensive metabolic panel     Status: Abnormal   Collection Time: 11/14/22 10:53 AM  Result Value Ref Range   Sodium 138 135 - 145 mmol/L   Potassium 3.8 3.5 - 5.1 mmol/L   Chloride 108 98 - 111 mmol/L   CO2 23 22 - 32 mmol/L   Glucose, Bld 93 70 - 99 mg/dL    Comment: Glucose reference range applies only to samples taken after fasting for at least 8 hours.   BUN 10 6 - 20 mg/dL   Creatinine, Ser 2.44 0.44 - 1.00 mg/dL   Calcium 8.7 (L) 8.9 - 10.3 mg/dL   Total Protein 6.2 (L) 6.5 - 8.1 g/dL   Albumin 3.3 (L) 3.5 - 5.0 g/dL   AST 010 (H) 15 - 41 U/L   ALT 132 (H) 0 - 44 U/L   Alkaline Phosphatase 56 38 - 126 U/L   Total Bilirubin 0.9 0.3 - 1.2 mg/dL   GFR, Estimated >27 >25 mL/min    Comment: (NOTE) Calculated using the CKD-EPI Creatinine Equation (2021)    Anion gap 7 5 - 15    Comment:  Performed at [facility], [address] [place], [place] [address]  Phosphorus     Status: None   Collection Time: 11/14/22 10:53 AM  Result Value Ref Range   Phosphorus 2.9 2.5 - 4.6 mg/dL    Comment: Performed at [facility], [address] [place], [place] [address]  Magnesium     Status: None   Collection Time: 11/14/22 10:53 AM  Result Value Ref Range   Magnesium 1.9 1.7 - 2.4 mg/dL    Comment: Performed at [facility], [address] [place], [place] [address]  CBC with Differential/Platelet     Status: Abnormal   Collection Time: 11/14/22 10:53 AM  Result Value Ref Range   WBC 9.9 4.0 - 10.5 K/uL   RBC 3.97 3.87 - 5.11 MIL/uL   Hemoglobin 11.2 (L) 12.0 - 15.0 g/dL   HCT 38.7 (L) 56.4 - 33.2 %   MCV 88.2 80.0 - 100.0 fL   MCH 28.2 26.0 - 34.0 pg   MCHC 32.0 30.0 - 36.0 g/dL   RDW 95.1 88.4 - 16.6 %   Platelets 252 150 - 400 K/uL   nRBC 0.0 0.0 - 0.2 %   Neutrophils Relative % 67 %   Neutro Abs 6.6 1.7 - 7.7 K/uL   Lymphocytes Relative 23 %   Lymphs Abs 2.3 0.7 - 4.0 K/uL   Monocytes Relative 5 %   Monocytes Absolute 0.5 0.1 - 1.0 K/uL   Eosinophils Relative 5 %   Eosinophils Absolute 0.5 0.0 - 0.5 K/uL   Basophils Relative 0 %   Basophils Absolute 0.0 0.0 - 0.1 K/uL   Immature Granulocytes 0 %   Abs Immature Granulocytes 0.03 0.00 - 0.07 K/uL    Comment: Performed at [facility], [address] [place], [place] [address]  Antistreptolysin O titer     Status: None   Collection Time: 11/14/22 10:53 AM  Result Value Ref Range   ASO 39.0 0.0 - 200.0 IU/mL  Comment: (NOTE) Performed At: [facility] [address] [place], [place] [id number] Jolene Schimke MD WU:[id number]   Aldolase     Status: None   Collection Time: 11/14/22  1:09 PM  Result Value Ref Range   Aldolase 3.8 3.3 - 10.3 U/L    Comment: (NOTE) Performed At: [facility] [address] [place], [place] [id number] Jolene Schimke MD  VH:[id number]   Lactate dehydrogenase     Status: Abnormal   Collection Time: 11/14/22  1:09 PM  Result Value Ref Range   LDH 1,398 (H) 98 - 192 U/L    Comment: Performed at [facility], [address] [place], [place] [address]  Protein electrophoresis, serum     Status: None   Collection Time: 11/14/22  1:09 PM  Result Value Ref Range   Total Protein ELP 6.2 6.0 - 8.5 g/dL   Albumin ELP 3.2 2.9 - 4.4 g/dL   [id number] 0.3 0.0 - 0.4 g/dL   HKVQQ-5-[id number] 0.9 0.4 - 1.0 g/dL   Beta Globulin 0.9 0.7 - 1.3 g/dL   Gamma Globulin 0.9 0.4 - 1.8 g/dL   M-Spike, % Not Observed Not Observed g/dL   SPE Interp. Comment     Comment: (NOTE) The SPE pattern appears unremarkable. Evidence of monoclonal protein is not apparent. Performed At: [facility] [address] [place], [place] [id number] Jolene Schimke MD CZ:[id number]    Comment Comment     Comment: (NOTE) Protein electrophoresis scan will follow via computer, mail, or courier delivery.    Globulin, Total 3.0 2.2 - 3.9 g/dL   A/G Ratio 1.1 0.7 - 1.7  Kappa/lambda light chains     Status: None   Collection Time: 11/14/22  1:09 PM  Result Value Ref Range   Kappa free light chain 15.3 3.3 - 19.4 mg/L   Lambda free light chains 12.8 5.7 - 26.3 mg/L   Kappa, lambda light chain ratio 1.20 0.26 - 1.65    Comment: (NOTE) Performed At: [facility] [address] [place], [place] [id number] Jolene Schimke MD UK:[id number]   CK     Status: Abnormal   Collection Time: 11/14/22 10:58 PM  Result Value Ref Range   Total CK >50,000 (H) 38 - 234 U/L    Comment: RESULT CONFIRMED BY MANUAL DILUTION RH Performed at [facility], [address] [place], [place] [address]   Cytology - PAP     Status: Abnormal   Collection Time: 11/29/22 10:39 AM  Result Value Ref Range   Neisseria Gonorrhea Negative    Chlamydia Negative    Trichomonas Negative    Adequacy      Satisfactory for evaluation;  transformation zone component PRESENT.   Diagnosis - Low grade squamous intraepithelial lesion (LSIL) (A)    Comment Normal Reference Range Trichomonas - Negative    Comment Normal Reference Ranger Chlamydia - Negative    Comment      Normal Reference Range Neisseria Gonorrhea - Negative  RPR+HBsAg+HCVAb+...     Status: None   Collection Time: 11/29/22 11:26 AM  Result Value Ref Range   Hepatitis B Surface Ag Negative Negative   Hep C Virus Ab Non Reactive Non Reactive    Comment: HCV antibody alone does not differentiate between previously resolved infection and active infection. Equivocal and Reactive HCV antibody results should be followed up with an HCV RNA test to support the diagnosis of active HCV infection.    RPR Ser Ql Non Reactive Non Reactive  HIV Screen 4th Generation wRfx Non Reactive Non Reactive    Comment: HIV-1/HIV-2 antibodies and HIV-1 p24 antigen were NOT detected. There is no laboratory evidence of HIV infection. HIV Negative   COMPLETE METABOLIC PANEL WITH GFR     Status: Abnormal   Collection Time: 11/30/22  9:31 AM  Result Value Ref Range   Glucose, Bld 84 65 - 99 mg/dL    Comment: .            Fasting reference interval .    BUN 12 7 - 25 mg/dL   Creat 1.61 0.96 - 0.45 mg/dL   eGFR 409 > OR = 60 WJ/XBJ/4.78G9   BUN/Creatinine Ratio SEE NOTE: 6 - 22 (calc)    Comment:    Not Reported: BUN and Creatinine are within    reference range. .    Sodium 138 135 - 146 mmol/L   Potassium 4.0 3.5 - 5.3 mmol/L   Chloride 105 98 - 110 mmol/L   CO2 25 20 - 32 mmol/L   Calcium 9.2 8.6 - 10.2 mg/dL   Total Protein 6.6 6.1 - 8.1 g/dL   Albumin 3.9 3.6 - 5.1 g/dL   Globulin 2.7 1.9 - 3.7 g/dL (calc)   AG Ratio 1.4 1.0 - 2.5 (calc)   Total Bilirubin 0.7 0.2 - 1.2 mg/dL   Alkaline phosphatase (APISO) 74 31 - 125 U/L   AST 34 (H) 10 - 30 U/L   ALT 44 (H) 6 - 29 U/L  CK (Creatine Kinase)     Status: Abnormal   Collection Time: 11/30/22  9:31 AM  Result Value  Ref Range   Total CK 644 (H) 29 - 143 U/L  EBV ab to viral capsid ag pnl, IgG+IgM     Status: Abnormal   Collection Time: 12/04/22  2:46 PM  Result Value Ref Range   EBV VCA IgG >600.0 (H) 0.0 - 17.9 U/mL    Comment: (NOTE)                                 Negative        <18.0                                 Equivocal 18.0 - 21.9                                 Positive        >21.9 Performed At: [facility] [address] [place], [place] [id number] Jolene Schimke MD [id number]    EBV VCA IgM 75.4 (H) 0.0 - 35.9 U/mL    Comment: (NOTE)                                 Negative        <36.0                                 Equivocal 36.0 - 43.9                                 Positive        >  43.9   Comprehensive metabolic panel     Status: Abnormal   Collection Time: 12/04/22  2:46 PM  Result Value Ref Range   Sodium 137 135 - 145 mmol/L   Potassium 3.6 3.5 - 5.1 mmol/L   Chloride 106 98 - 111 mmol/L   CO2 23 22 - 32 mmol/L   Glucose, Bld 87 70 - 99 mg/dL    Comment: Glucose reference range applies only to samples taken after fasting for at least 8 hours.   BUN 11 6 - 20 mg/dL   Creatinine, Ser 1.61 0.44 - 1.00 mg/dL   Calcium 9.0 8.9 - 09.6 mg/dL   Total Protein 7.6 6.5 - 8.1 g/dL   Albumin 4.4 3.5 - 5.0 g/dL   AST 29 15 - 41 U/L   ALT 33 0 - 44 U/L   Alkaline Phosphatase 77 38 - 126 U/L   Total Bilirubin 1.7 (H) 0.3 - 1.2 mg/dL   GFR, Estimated >04 >54 mL/min    Comment: (NOTE) Calculated using the CKD-EPI Creatinine Equation (2021)    Anion gap 8 5 - 15    Comment: Performed at [facility], [address] [place], [place] [address]  CK (Creatine Kinase)     Status: Abnormal   Collection Time: 12/04/22  2:46 PM  Result Value Ref Range   Total CK 362 (H) 38 - 234 U/L    Comment: Performed at [facility], [address] [place], [place] [address]  CBC with Differential/Platelet     Status: Abnormal   Collection Time: 12/04/22  2:46  PM  Result Value Ref Range   WBC 12.8 (H) 4.0 - 10.5 K/uL   RBC 4.30 3.87 - 5.11 MIL/uL   Hemoglobin 12.2 12.0 - 15.0 g/dL   HCT 29.5 62.1 - 30.8 %   MCV 88.8 80.0 - 100.0 fL   MCH 28.4 26.0 - 34.0 pg   MCHC 31.9 30.0 - 36.0 g/dL   RDW 65.7 84.6 - 96.2 %   Platelets 247 150 - 400 K/uL   nRBC 0.0 0.0 - 0.2 %   Neutrophils Relative % 62 %   Neutro Abs 8.0 (H) 1.7 - 7.7 K/uL   Lymphocytes Relative 21 %   Lymphs Abs 2.7 0.7 - 4.0 K/uL   Monocytes Relative 8 %   Monocytes Absolute 1.1 (H) 0.1 - 1.0 K/uL   Eosinophils Relative 8 %   Eosinophils Absolute 1.0 (H) 0.0 - 0.5 K/uL   Basophils Relative 1 %   Basophils Absolute 0.1 0.0 - 0.1 K/uL   Immature Granulocytes 0 %   Abs Immature Granulocytes 0.03 0.00 - 0.07 K/uL    Comment: Performed at [facility], [address] [place], [place] [address]    Diabetic Foot Exam: Diabetic Foot Exam - Simple   No data filed    ***  PHQ2/9:    12/04/2022    1:57 PM 11/30/2022    9:07 AM 11/29/2022    2:54 PM 11/02/2022    2:21 PM 09/26/2022    9:00 AM  Depression screen PHQ 2/9  Decreased Interest 0 0 1 0 0  Down, Depressed, Hopeless 1 1 1  0 0  PHQ - 2 Score 1 1 2  0 0  Altered sleeping 0 0 3 0 0  Tired, decreased energy 2 0 2 0 0  Change in appetite 2 0 2 0 0  Feeling bad or failure about yourself  1 0 1 0 0  Trouble concentrating  3 0 3 0 0  Moving slowly or fidgety/restless 1 0 1 0 0  Suicidal thoughts 0 0 0 0 0  PHQ-9 Score 10 1 14  0 0  Difficult doing work/chores Somewhat difficult Not difficult at all  Not difficult at all Not difficult at all    phq 9 is {gen pos [id number]} ***  Fall Risk:    12/04/2022    1:56 PM 11/30/2022    9:03 AM 11/02/2022    2:15 PM 09/26/2022    9:00 AM 05/16/2022    1:05 PM  Fall Risk   Falls in the past year? 0 0 0 0 0  Number falls in past yr:  0 0 0 0  Injury with Fall?  0 0 0 0  Risk for fall due to : No Fall Risks      Follow up Falls prevention discussed        ***   Functional Status Survey:   ***   Assessment & Plan  *** There are no diagnoses linked to this encounter.

## 2022-12-06 NOTE — Progress Notes (Signed)
Name: Heather Schmidt   MRN: [id number]    DOB: 08/23/1995   Date:12/06/2022       Progress Note  Subjective  Chief Complaint  Chief Complaint  Patient presents with   Cough    With wheezing/no production x3 days    HPI  Mono and acute asthma flare: she is feeling better today, still has fatigue, sore throat comes and goes. No fever or chills. She is feeling fatigued. 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   Eczema 03/31/2020   Generalized anxiety disorder 12/05/2019   Abnormal cervical Papanicolaou smear 10/31/2019   Vitamin D insufficiency 10/31/2019   Nonintractable episodic headache 10/31/2019   Depression 10/31/2019   History of anaphylaxis 10/31/2019   Narcolepsy 10/08/2019    Social History   Tobacco Use   Smoking status: Never   Smokeless tobacco: Never  Substance Use Topics   Alcohol use: Yes    Comment: rarely     Current Outpatient Medications:    albuterol (VENTOLIN HFA) 108 (90 Base) MCG/ACT inhaler, Inhale 2 puffs into the lungs every 6 (six) hours as needed for wheezing., Disp: 1 each, Rfl: 1   magic mouthwash (lidocaine, diphenhydrAMINE, alum & mag hydroxide) suspension, Swish and spit 5 mLs 4 (four) times daily as needed for mouth pain., Disp: 360 mL, Rfl: 0  Allergies  Allergen Reactions   Fish Allergy Anaphylaxis   Peanut-Containing Drug Products Anaphylaxis   Shellfish Allergy Anaphylaxis   Egg [Egg-Derived  Products] Nausea And Vomiting   Other Cough    Ginger and barley   Latex Rash    ROS  Ten systems reviewed and is negative except as mentioned in HPI   Objective  Vitals:   12/06/22 1213  BP: 106/70  Pulse: 96  Resp: 12  Temp: 97.9 F (36.6 C)  TempSrc: Oral  SpO2: 97%  Weight: 129 lb 4.8 oz (58.7 kg)  Height: 5\' 2"  (1.575 m)    Body mass index is 23.65 kg/m.    Physical Exam  Constitutional: Patient appears well-developed and well-nourished. No distress.  HEENT: head atraumatic, normocephalic, pupils equal and reactive to light, neck supple Cardiovascular: Normal rate, regular rhythm and normal heart sounds.  No murmur heard. No BLE edema. Pulmonary/Chest: Effort normal and breath sounds normal. No respiratory distress. Abdominal: Soft.  There is no tenderness. Psychiatric: Patient has a normal mood and affect. behavior is normal. Judgment and thought content normal.   Recent Results (from the past 2160 hour(s))  Novel Coronavirus, NAA ([facility])     Status: None   Collection Time: 09/26/22 12:00 AM   Specimen: Nasopharyngeal(NP) swabs in vial transport medium   Nasopharynge  Previous  Result Value Ref Range   SARS-CoV-2, NAA Not Detected Not Detected    Comment: This nucleic acid amplification test was developed and its performance characteristics determined by [employer]. Nucleic acid amplification tests include RT-PCR and TMA. This test has not been FDA  cleared or approved. This test has been authorized by FDA under an Emergency Use Authorization (EUA). This test is only authorized for the duration of time the declaration that circumstances exist justifying the authorization of the emergency use of in vitro diagnostic tests for detection of SARS-CoV-2 virus and/or diagnosis of COVID-19 infection under section 564(b)(1) of the Act, 21 U.S.C. 161WRU-0(A) (1), unless the authorization is terminated or revoked sooner. When diagnostic testing is  negative, the possibility of a false negative result should be considered in the context of a patient's recent exposures and the presence of clinical signs and symptoms consistent with COVID-19. An individual without symptoms of COVID-19 and who is not shedding SARS-CoV-2 virus wo uld expect to have a negative (not detected) result in this assay.   Specimen status report     Status: None   Collection Time: 09/26/22 12:00 AM  Result Value Ref Range   specimen status report Comment     Comment: Please note Please note The date and/or time of collection was not indicated on the requisition as required by state and federal law.  The date of receipt of the specimen was used as the collection date if not supplied.   POCT rapid strep A     Status: None   Collection Time: 09/26/22  9:02 AM  Result Value Ref Range   Rapid Strep A Screen Negative Negative  Novel Coronavirus, NAA ([facility])     Status: None   Collection Time: 11/02/22 12:00 AM   Specimen: Nasopharyngeal(NP) swabs in vial transport medium   Nasopharynge  Previous  Result Value Ref Range   SARS-CoV-2, NAA Not Detected Not Detected    Comment: This nucleic acid amplification test was developed and its performance characteristics determined by [employer]. Nucleic acid amplification tests include RT-PCR and TMA. This test has not been FDA cleared or approved. This test has been authorized by FDA under an Emergency Use Authorization (EUA). This test is only authorized for the duration of time the declaration that circumstances exist justifying the authorization of the emergency use of in vitro diagnostic tests for detection of SARS-CoV-2 virus and/or diagnosis of COVID-19 infection under section 564(b)(1) of the Act, 21 U.S.C. 540JWJ-1(B) (1), unless the authorization is terminated or revoked sooner. When diagnostic testing is negative, the possibility of a false negative result should be considered in the context of  a patient's recent exposures and the presence of clinical signs and symptoms consistent with COVID-19. An individual without symptoms of COVID-19 and who is not shedding SARS-CoV-2 virus wo uld expect to have a negative (not detected) result in this assay.   POCT rapid strep A     Status: Abnormal   Collection Time: 11/02/22  2:23 PM  Result Value Ref Range   Rapid Strep A Screen Positive (A) Negative  CBC     Status: None   Collection Time: 11/13/22 12:25 PM  Result Value Ref Range   WBC 9.5 4.0 - 10.5 K/uL   RBC 4.25 3.87 - 5.11 MIL/uL   Hemoglobin 12.3 12.0 - 15.0 g/dL   HCT 14.7 82.9 - 56.2 %   MCV 88.9 80.0 - 100.0 fL   MCH 28.9 26.0 - 34.0 pg   MCHC 32.5 30.0 - 36.0 g/dL   RDW 13.0 86.5 - 78.4 %   Platelets 307 150 - 400 K/uL   nRBC 0.0 0.0 - 0.2 %    Comment: Performed at [facility], [address] [place], [place] [address]  Basic metabolic panel     Status: Abnormal   Collection Time: 11/13/22 12:25 PM  Result Value Ref Range   Sodium 138 135 - 145 mmol/L   Potassium 3.9 3.5 - 5.1 mmol/L   Chloride 106 98 - 111 mmol/L   CO2 23 22 - 32 mmol/L   Glucose, Bld 112 (H) 70 - 99 mg/dL    Comment: Glucose reference range applies only to samples taken after fasting for at least 8 hours.   BUN 12 6 - 20 mg/dL   Creatinine, Ser 5.40 0.44 - 1.00 mg/dL   Calcium 9.0 8.9 - 98.1 mg/dL   GFR, Estimated >19 >14 mL/min    Comment: (NOTE) Calculated using the CKD-EPI Creatinine Equation (2021)    Anion gap 9 5 - 15    Comment: Performed at [facility], [address] [place], [place] [address]  Urinalysis, Routine w reflex microscopic -Urine, Clean Catch     Status: Abnormal   Collection Time: 11/13/22 12:25 PM  Result Value Ref Range   Color, Urine YELLOW YELLOW   APPearance CLEAR (A) CLEAR   Specific Gravity, Urine 1.010 1.005 - 1.030   pH 6.0 5.0 - 8.0   Glucose, UA NEGATIVE NEGATIVE mg/dL   Hgb urine dipstick LARGE (A) NEGATIVE   Bilirubin Urine  NEGATIVE NEGATIVE   Ketones, ur NEGATIVE NEGATIVE mg/dL   Protein, ur 30 (A) NEGATIVE mg/dL   Nitrite NEGATIVE NEGATIVE   Leukocytes,Ua NEGATIVE NEGATIVE   RBC / HPF 0-5 0 - 5 RBC/hpf   WBC, UA 0-5 0 - 5 WBC/hpf   Bacteria, UA NONE SEEN NONE SEEN   Squamous Epithelial / HPF 0-5 0 - 5 /HPF   Mucus PRESENT     Comment: Performed at [facility], [address] [place], [place] [address]  CK     Status: Abnormal   Collection Time: 11/13/22 12:25 PM  Result Value Ref Range   Total CK >50,000 (H) 38 - 234 U/L    Comment: RESULT CONFIRMED BY MANUAL DILUTION MW Performed at [facility], [address], [place], [place] [address]   Hepatic function panel     Status: Abnormal   Collection Time: 11/13/22 12:25 PM  Result Value Ref Range   Total Protein 7.1 6.5 - 8.1 g/dL   Albumin 3.9 3.5 - 5.0 g/dL   AST 469 (H) 15 - 41 U/L   ALT 155 (H) 0 - 44 U/L   Alkaline Phosphatase 65 38 - 126 U/L   Total Bilirubin 1.0 0.3 - 1.2 mg/dL   Bilirubin, Direct 0.1 0.0 - 0.2 mg/dL   Indirect Bilirubin 0.9 0.3 - 0.9 mg/dL    Comment: Performed at [facility], [address] [place], [place] [address]  Sedimentation rate     Status: Abnormal   Collection Time: 11/13/22  3:17 PM  Result Value Ref Range   Sed Rate 30 (H) 0 - 20 mm/hr    Comment: Performed at [facility], [address] [place], [place] [address]  Hepatitis panel, acute     Status: None   Collection Time: 11/13/22  3:25 PM  Result Value Ref Range   Hepatitis B Surface Ag NON REACTIVE NON REACTIVE   HCV Ab NON REACTIVE NON REACTIVE    Comment: (NOTE) Nonreactive HCV antibody screen is consistent with no HCV infections,  unless recent infection is suspected or other evidence exists to indicate HCV infection.     Hep A IgM NON  REACTIVE NON REACTIVE   Hep B C IgM NON REACTIVE NON REACTIVE    Comment: Performed at [facility], [address], [place], [place] [address]  Urine Drug  Screen, Qualitative ([facility] only)     Status: None   Collection Time: 11/13/22  3:25 PM  Result Value Ref Range   Tricyclic, Ur Screen NONE DETECTED NONE DETECTED   Amphetamines, Ur Screen NONE DETECTED NONE DETECTED   MDMA (Ecstasy)Ur Screen NONE DETECTED NONE DETECTED   Cocaine Metabolite,Ur [place] NONE DETECTED NONE DETECTED   Opiate, Ur Screen NONE DETECTED NONE DETECTED   Phencyclidine (PCP) Ur S NONE DETECTED NONE DETECTED   Cannabinoid 50 Ng, Ur [place] NONE DETECTED NONE DETECTED   Barbiturates, Ur Screen NONE DETECTED NONE DETECTED   Benzodiazepine, Ur Scrn NONE DETECTED NONE DETECTED   Methadone Scn, Ur NONE DETECTED NONE DETECTED    Comment: (NOTE) Tricyclics + metabolites, urine    Cutoff 1000 ng/mL Amphetamines + metabolites, urine  Cutoff 1000 ng/mL MDMA (Ecstasy), urine              Cutoff 500 ng/mL Cocaine Metabolite, urine          Cutoff 300 ng/mL Opiate + metabolites, urine        Cutoff 300 ng/mL Phencyclidine (PCP), urine         Cutoff 25 ng/mL Cannabinoid, urine                 Cutoff 50 ng/mL Barbiturates + metabolites, urine  Cutoff 200 ng/mL Benzodiazepine, urine              Cutoff 200 ng/mL Methadone, urine                   Cutoff 300 ng/mL  The urine drug screen provides only a preliminary, unconfirmed analytical test result and should not be used for non-medical purposes. Clinical consideration and professional judgment should be applied to any positive drug screen result due to possible interfering substances. A more specific alternate chemical method must be used in order to obtain a confirmed analytical result. Gas chromatography / mass spectrometry (GC/MS) is the preferred confirm atory method. Performed at [facility], [address] [place], [place] [address]   ANA w/Reflex     Status: None   Collection Time: 11/13/22  3:25 PM  Result Value Ref Range   Anti Nuclear Antibody (ANA) Negative Negative    Comment: (NOTE) Performed At: [facility] [address] [place] [employer], [place] [id number] Jolene Schimke MD [id number]:[id number]   C-reactive protein     Status: None   Collection Time: 11/13/22  3:25 PM  Result Value Ref Range   CRP 0.9 <1.0 mg/dL    Comment: Performed at [facility], [address], [place], [place] [address]  TSH     Status: None   Collection Time: 11/13/22  3:25 PM  Result Value Ref Range   TSH 1.410 0.350 - 4.500 uIU/mL    Comment: Performed by a 3rd Generation assay with a functional sensitivity of <=0.01 uIU/mL. Performed at [facility], [address] [place], [place] [address]   Acetaminophen level     Status: None   Collection Time: 11/13/22  5:47 PM  Result Value Ref Range   Acetaminophen (Tylenol), Serum 12 10 - 30 ug/mL    Comment: (NOTE) Therapeutic concentrations vary significantly. A range of 10-30 ug/mL  may be an effective concentration for many patients. However, some  are best treated at concentrations outside of this range. Acetaminophen concentrations >150 ug/mL at 4 hours after ingestion  and >50 ug/mL at 12 hours after ingestion are often associated with  toxic reactions.  Performed at [facility], [address] [place], [place] [address]   CK     Status: Abnormal   Collection Time: 11/14/22 12:21 AM  Result Value Ref Range   Total CK 50,000 (H) 38 - 234 U/L    Comment: RESULT CONFIRMED BY MANUAL DILUTION SKL Performed at [facility], [address] [place], [place] [address]   CK     Status: Abnormal   Collection Time: 11/14/22 10:53 AM  Result Value Ref Range   Total CK >50,000 (H) 38 - 234 U/L    Comment: RESULT CONFIRMED BY MANUAL DILUTION MU Performed at [facility], [address] [place], [place] [address]   Comprehensive metabolic panel     Status: Abnormal   Collection Time: 11/14/22 10:53 AM  Result Value Ref Range   Sodium 138 135 - 145 mmol/L   Potassium 3.8 3.5 - 5.1 mmol/L   Chloride 108 98 -  111 mmol/L   CO2 23 22 - 32 mmol/L   Glucose, Bld 93 70 - 99 mg/dL    Comment: Glucose reference range applies only to samples taken after fasting for at least 8 hours.   BUN 10 6 - 20 mg/dL   Creatinine, Ser 9.14 0.44 - 1.00 mg/dL   Calcium 8.7 (L) 8.9 - 10.3 mg/dL   Total Protein 6.2 (L) 6.5 - 8.1 g/dL   Albumin 3.3 (L) 3.5 - 5.0 g/dL   AST 782 (H) 15 - 41 U/L   ALT 132 (H) 0 - 44 U/L   Alkaline Phosphatase 56 38 - 126 U/L   Total Bilirubin 0.9 0.3 - 1.2 mg/dL   GFR, Estimated >95 >62 mL/min    Comment: (NOTE) Calculated using the CKD-EPI Creatinine Equation (2021)    Anion gap 7 5 - 15    Comment: Performed at [facility], [address] [place], [place] [address]  Phosphorus     Status: None   Collection Time: 11/14/22 10:53 AM  Result Value Ref Range   Phosphorus 2.9 2.5 - 4.6 mg/dL    Comment: Performed at [facility], [address] [place], [place] [address]  Magnesium     Status: None   Collection Time: 11/14/22 10:53 AM  Result Value Ref Range   Magnesium 1.9 1.7 - 2.4 mg/dL    Comment: Performed at [facility], [address] [place], [place] [address]  CBC with Differential/Platelet     Status: Abnormal   Collection Time: 11/14/22 10:53 AM  Result Value Ref Range   WBC 9.9 4.0 - 10.5 K/uL   RBC 3.97 3.87 - 5.11 MIL/uL   Hemoglobin 11.2 (L) 12.0 - 15.0 g/dL   HCT 28.4 (L) 13.2 - 44.0 %   MCV 88.2 80.0 - 100.0 fL   MCH 28.2 26.0 - 34.0 pg   MCHC 32.0 30.0 - 36.0 g/dL   RDW 10.2 72.5 - 36.6 %   Platelets 252 150 - 400 K/uL   nRBC 0.0 0.0 - 0.2 %   Neutrophils Relative % 67 %   Neutro Abs 6.6 1.7 - 7.7 K/uL   Lymphocytes Relative 23 %   Lymphs Abs 2.3 0.7 - 4.0 K/uL   Monocytes Relative 5 %   Monocytes Absolute 0.5 0.1 - 1.0 K/uL  Eosinophils Relative 5 %   Eosinophils Absolute 0.5 0.0 - 0.5 K/uL   Basophils Relative 0 %   Basophils Absolute 0.0 0.0 - 0.1 K/uL   Immature Granulocytes 0 %   Abs Immature Granulocytes 0.03  0.00 - 0.07 K/uL    Comment: Performed at [facility], [address], [place], [place] [address]  Antistreptolysin O titer     Status: None   Collection Time: 11/14/22 10:53 AM  Result Value Ref Range   ASO 39.0 0.0 - 200.0 IU/mL    Comment: (NOTE) Performed At: [facility] [address] [place], [place] [id number] Jolene Schimke MD [id number]   Aldolase     Status: None   Collection Time: 11/14/22  1:09 PM  Result Value Ref Range   Aldolase 3.8 3.3 - 10.3 U/L    Comment: (NOTE) Performed At: [facility] [address] [place], [place] [id number] Jolene Schimke MD [id number]   Lactate dehydrogenase     Status: Abnormal   Collection Time: 11/14/22  1:09 PM  Result Value Ref Range   LDH 1,398 (H) 98 - 192 U/L    Comment: Performed at [facility] [address] [place], [place] [address]  Protein electrophoresis, serum     Status: None   Collection Time: 11/14/22  1:09 PM  Result Value Ref Range   Total Protein ELP 6.2 6.0 - 8.5 g/dL   Albumin ELP 3.2 2.9 - 4.4 g/dL   [id number]-[id number] 0.3 0.0 - 0.4 g/dL   [id number]-8-[id number] 0.9 0.4 - 1.0 g/dL   Beta Globulin 0.9 0.7 - 1.3 g/dL   Gamma Globulin 0.9 0.4 - 1.8 g/dL   M-Spike, % Not Observed Not Observed g/dL   SPE Interp. Comment     Comment: (NOTE) The SPE pattern appears unremarkable. Evidence of monoclonal protein is not apparent. Performed At: [facility] [address] [place], [place] [id number] Jolene Schimke MD YT:[id number]    Comment Comment     Comment: (NOTE) Protein electrophoresis scan will follow via computer, mail, or courier delivery.    Globulin, Total 3.0 2.2 - 3.9 g/dL   A/G Ratio 1.1 0.7 - 1.7  Kappa/lambda light chains     Status: None   Collection Time: 11/14/22  1:09 PM  Result Value Ref Range   Kappa free light chain 15.3 3.3 - 19.4 mg/L   Lambda free light chains 12.8 5.7 - 26.3 mg/L   Kappa, lambda light chain ratio 1.20 0.26 - 1.65     Comment: (NOTE) Performed At: [facility] [address] [place], [place] [id number] Jolene Schimke MD [id number]   CK     Status: Abnormal   Collection Time: 11/14/22 10:58 PM  Result Value Ref Range   Total CK >50,000 (H) 38 - 234 U/L    Comment: RESULT CONFIRMED BY MANUAL DILUTION RH Performed at [facility], [address] [place], [place] [address]   Cytology - PAP     Status: Abnormal   Collection Time: 11/29/22 10:39 AM  Result Value Ref Range   Neisseria Gonorrhea Negative    Chlamydia Negative    Trichomonas Negative    Adequacy      Satisfactory for evaluation; transformation zone component PRESENT.   Diagnosis - Low grade squamous intraepithelial lesion (LSIL) (A)    Comment Normal Reference Range Trichomonas - Negative    Comment Normal Reference [place] Chlamydia - Negative    Comment      Normal Reference Range Neisseria  Gonorrhea - Negative  RPR+HBsAg+HCVAb+...     Status: None   Collection Time: 11/29/22 11:26 AM  Result Value Ref Range   Hepatitis B Surface Ag Negative Negative   Hep C Virus Ab Non Reactive Non Reactive    Comment: HCV antibody alone does not differentiate between previously resolved infection and active infection. Equivocal and Reactive HCV antibody results should be followed up with an HCV RNA test to support the diagnosis of active HCV infection.    RPR Ser Ql Non Reactive Non Reactive   HIV Screen 4th Generation wRfx Non Reactive Non Reactive    Comment: HIV-1/HIV-2 antibodies and HIV-1 p24 antigen were NOT detected. There is no laboratory evidence of HIV infection. HIV Negative   COMPLETE METABOLIC PANEL WITH GFR     Status: Abnormal   Collection Time: 11/30/22  9:31 AM  Result Value Ref Range   Glucose, Bld 84 65 - 99 mg/dL    Comment: .            Fasting reference interval .    BUN 12 7 - 25 mg/dL   Creat 4.09 8.11 - 9.14 mg/dL   eGFR 782 > OR = 60 NF/AOZ/3.08M5   BUN/Creatinine Ratio SEE NOTE: 6 -  22 (calc)    Comment:    Not Reported: BUN and Creatinine are within    reference range. .    Sodium 138 135 - 146 mmol/L   Potassium 4.0 3.5 - 5.3 mmol/L   Chloride 105 98 - 110 mmol/L   CO2 25 20 - 32 mmol/L   Calcium 9.2 8.6 - 10.2 mg/dL   Total Protein 6.6 6.1 - 8.1 g/dL   Albumin 3.9 3.6 - 5.1 g/dL   Globulin 2.7 1.9 - 3.7 g/dL (calc)   AG Ratio 1.4 1.0 - 2.5 (calc)   Total Bilirubin 0.7 0.2 - 1.2 mg/dL   Alkaline phosphatase (APISO) 74 31 - 125 U/L   AST 34 (H) 10 - 30 U/L   ALT 44 (H) 6 - 29 U/L  CK (Creatine Kinase)     Status: Abnormal   Collection Time: 11/30/22  9:31 AM  Result Value Ref Range   Total CK 644 (H) 29 - 143 U/L  EBV ab to viral capsid ag pnl, IgG+IgM     Status: Abnormal   Collection Time: 12/04/22  2:46 PM  Result Value Ref Range   EBV VCA IgG >600.0 (H) 0.0 - 17.9 U/mL    Comment: (NOTE)                                 Negative        <18.0                                 Equivocal 18.0 - 21.9                                 Positive        >21.9 Performed At: [facility] [address] [place], [place] [id number] Jolene Schimke MD [id number]    EBV VCA IgM 75.4 (H) 0.0 - 35.9 U/mL    Comment: (NOTE)  Negative        <36.0                                 Equivocal 36.0 - 43.9                                 Positive        >43.9   Comprehensive metabolic panel     Status: Abnormal   Collection Time: 12/04/22  2:46 PM  Result Value Ref Range   Sodium 137 135 - 145 mmol/L   Potassium 3.6 3.5 - 5.1 mmol/L   Chloride 106 98 - 111 mmol/L   CO2 23 22 - 32 mmol/L   Glucose, Bld 87 70 - 99 mg/dL    Comment: Glucose reference range applies only to samples taken after fasting for at least 8 hours.   BUN 11 6 - 20 mg/dL   Creatinine, Ser 1.61 0.44 - 1.00 mg/dL   Calcium 9.0 8.9 - 09.6 mg/dL   Total Protein 7.6 6.5 - 8.1 g/dL   Albumin 4.4 3.5 - 5.0 g/dL   AST 29 15 - 41 U/L   ALT 33 0 - 44 U/L    Alkaline Phosphatase 77 38 - 126 U/L   Total Bilirubin 1.7 (H) 0.3 - 1.2 mg/dL   GFR, Estimated >04 >54 mL/min    Comment: (NOTE) Calculated using the CKD-EPI Creatinine Equation (2021)    Anion gap 8 5 - 15    Comment: Performed at [facility], [address] [place], [place] [address]  CK (Creatine Kinase)     Status: Abnormal   Collection Time: 12/04/22  2:46 PM  Result Value Ref Range   Total CK 362 (H) 38 - 234 U/L    Comment: Performed at [facility], [address] [place], [place] [address]  CBC with Differential/Platelet     Status: Abnormal   Collection Time: 12/04/22  2:46 PM  Result Value Ref Range   WBC 12.8 (H) 4.0 - 10.5 K/uL   RBC 4.30 3.87 - 5.11 MIL/uL   Hemoglobin 12.2 12.0 - 15.0 g/dL   HCT 29.5 62.1 - 30.8 %   MCV 88.8 80.0 - 100.0 fL   MCH 28.4 26.0 - 34.0 pg   MCHC 31.9 30.0 - 36.0 g/dL   RDW 65.7 84.6 - 96.2 %   Platelets 247 150 - 400 K/uL   nRBC 0.0 0.0 - 0.2 %   Neutrophils Relative % 62 %   Neutro Abs 8.0 (H) 1.7 - 7.7 K/uL   Lymphocytes Relative 21 %   Lymphs Abs 2.7 0.7 - 4.0 K/uL   Monocytes Relative 8 %   Monocytes Absolute 1.1 (H) 0.1 - 1.0 K/uL   Eosinophils Relative 8 %   Eosinophils Absolute 1.0 (H) 0.0 - 0.5 K/uL   Basophils Relative 1 %   Basophils Absolute 0.1 0.0 - 0.1 K/uL   Immature Granulocytes 0 %   Abs Immature Granulocytes 0.03 0.00 - 0.07 K/uL    Comment: Performed at [facility], [address] [place], [place] [address]     Assessment & Plan  1. Mononucleosis syndrome  - budesonide-formoterol (SYMBICORT) 160-4.5 MCG/ACT inhaler; Inhale 2 puffs into the lungs 2 (two) times daily.  Dispense: 1 each; Refill: 1  Normal CXR   We gave her information about  Mono and what to expect   2. Leukocytosis, unspecified type  - CBC with Differential/Platelet; Future    3. Mild intermittent asthma with acute exacerbation  We will add symbicort

## 2022-12-08 ENCOUNTER — Ambulatory Visit: Admission: RE | Admit: 2022-12-08 | Payer: BC Managed Care – PPO | Source: Ambulatory Visit

## 2022-12-20 ENCOUNTER — Ambulatory Visit
Admission: RE | Admit: 2022-12-20 | Discharge: 2022-12-20 | Disposition: A | Discharge status: Home / Self Care | Payer: BC Managed Care – PPO | Source: Ambulatory Visit | Attending: Internal Medicine | Admitting: Internal Medicine

## 2022-12-20 DIAGNOSIS — M6282 Rhabdomyolysis: Secondary | ICD-10-CM | POA: Diagnosis not present

## 2022-12-20 DIAGNOSIS — R1012 Left upper quadrant pain: Secondary | ICD-10-CM

## 2022-12-20 DIAGNOSIS — B2790 Infectious mononucleosis, unspecified without complication: Secondary | ICD-10-CM | POA: Diagnosis not present

## 2022-12-20 DIAGNOSIS — R749 Abnormal serum enzyme level, unspecified: Secondary | ICD-10-CM | POA: Diagnosis not present

## 2022-12-20 DIAGNOSIS — Z09 Encounter for follow-up examination after completed treatment for conditions other than malignant neoplasm: Secondary | ICD-10-CM | POA: Diagnosis not present

## 2022-12-20 DIAGNOSIS — R7401 Elevation of levels of liver transaminase levels: Secondary | ICD-10-CM

## 2022-12-28 ENCOUNTER — Telehealth: Payer: Self-pay | Admitting: Internal Medicine

## 2022-12-28 DIAGNOSIS — Z111 Encounter for screening for respiratory tuberculosis: Secondary | ICD-10-CM

## 2022-12-28 NOTE — Telephone Encounter (Signed)
Copied from CRM [id number]. Topic: Appointment Scheduling - Scheduling Inquiry for Clinic >> Dec 27, 2022  1:44 PM Ja-Kwan M wrote: Reason for CRM: Pt would like to schedule tb test. Cb# [phone]

## 2022-12-28 NOTE — Telephone Encounter (Signed)
She needs for job and states no other paperwork required.  Pt notified and ordered

## 2023-01-02 ENCOUNTER — Ambulatory Visit (LOCAL_COMMUNITY_HEALTH_CENTER): Payer: Self-pay

## 2023-01-02 DIAGNOSIS — Z111 Encounter for screening for respiratory tuberculosis: Secondary | ICD-10-CM

## 2023-01-04 NOTE — Progress Notes (Deleted)
Established Patient Office Visit  Subjective   Patient ID: Heather Schmidt, female    DOB: 1996/04/01  Age: 27 y.o. MRN: [id number]  No chief complaint on file.   HPI  Patient is here today for follow up. Patient was admitted in May for rhabdomyolysis, transaminitis, potentially post-strep A myositis, EBV. Labs at the time showed CK >50,000, ESR 30, LDH 1300, platelets 307. CRP normal, ASO titer negative, ANA negative. LFT's elevated initially, trended down prior to discharge. US liver negative, acute hepatitis panel negative. She was discharged from [facility] on 11/15/22 and then went to [facility] for evaluation as well and was found to be positive for EBV. Repeat labs after discharge down-trending CK but mildly elevated leukocytes.  Abdominal US in June negative, chest x-ray also negative.   Patient Active Problem List   Diagnosis Date Noted   Elevated LFTs 11/14/2022   Rhabdomyolysis 11/13/2022   Transaminitis 11/13/2022   Infertility management 04/19/2022   HSV (herpes simplex virus) infection 04/19/2022   Secondary oligomenorrhea 03/31/2020   Eczema 03/31/2020   Generalized anxiety disorder 12/05/2019   Abnormal cervical Papanicolaou smear 10/31/2019   Vitamin D insufficiency 10/31/2019   Nonintractable episodic headache 10/31/2019   Depression 10/31/2019   History of anaphylaxis 10/31/2019   Narcolepsy 10/08/2019   Past Medical History:  Diagnosis Date   Asthma    Chlamydia 06/06/2019   COVID-19 06/23/2020   Depression    Narcolepsy    Past Surgical History:  Procedure Laterality Date   WISDOM TOOTH EXTRACTION     Social History   Tobacco Use   Smoking status: Never   Smokeless tobacco: Never  Vaping Use   Vaping Use: Never used  Substance Use Topics   Alcohol use: Yes    Comment: rarely   Drug use: Not Currently    Types: Marijuana   Social History   Socioeconomic History   Marital status: Media planner    Spouse name: Not on file   Number of  children: Not on file   Years of education: Not on file   Highest education level: Some college, no degree  Occupational History   Not on file  Tobacco Use   Smoking status: Never   Smokeless tobacco: Never  Vaping Use   Vaping Use: Never used  Substance and Sexual Activity   Alcohol use: Yes    Comment: rarely   Drug use: Not Currently    Types: Marijuana   Sexual activity: Yes    Birth control/protection: None  Other Topics Concern   Not on file  Social History Narrative   Not on file   Social Determinants of Health   Financial Resource Strain: Not on file  Food Insecurity: No Food Insecurity (11/14/2022)   Hunger Vital Sign    Worried About Running Out of Food in the Last Year: Never true    Ran Out of Food in the Last Year: Never true  Transportation Needs: No Transportation Needs (11/14/2022)   PRAPARE - Administrator, Civil Service (Medical): No    Lack of Transportation (Non-Medical): No  Physical Activity: Not on file  Stress: Not on file  Social Connections: Not on file  Intimate Partner Violence: Not At Risk (11/14/2022)   Humiliation, Afraid, Rape, and Kick questionnaire    Fear of Current or Ex-Partner: No    Emotionally Abused: No    Physically Abused: No    Sexually Abused: No   Family Status  Relation Name Status  Mother  Alive   Father  Alive   Sister  Alive   Brother  Alive   MGM  Alive   MGF  Deceased   PGM  Alive   PGF  Alive   Sister  Alive   Family History  Problem Relation Age of Onset   Asthma Mother    Thyroid disease Mother    Asthma Father    Cancer Maternal Grandmother    COPD Maternal Grandmother    Hypertension Paternal Grandmother    Hypertension Paternal Grandfather    Allergies  Allergen Reactions   Fish Allergy Anaphylaxis   Peanut-Containing Drug Products Anaphylaxis   Shellfish Allergy Anaphylaxis   Egg [Egg-Derived Products] Nausea And Vomiting   Other Cough    Ginger and barley   Latex Rash       Review of Systems  Constitutional:  Negative for chills and fever.  HENT:  Negative for sore throat.   Respiratory:  Negative for cough and shortness of breath.   Cardiovascular:  Negative for chest pain.      Objective:     LMP 12/14/2022 (Within Days)  BP Readings from Last 3 Encounters:  12/06/22 106/70  12/04/22 116/68  11/30/22 108/62   Wt Readings from Last 3 Encounters:  12/06/22 129 lb 4.8 oz (58.7 kg)  12/04/22 129 lb 11.2 oz (58.8 kg)  11/30/22 129 lb 14.4 oz (58.9 kg)      Physical Exam Constitutional:      Appearance: Normal appearance.  HENT:     Head: Normocephalic and atraumatic.  Eyes:     Conjunctiva/sclera: Conjunctivae normal.  Cardiovascular:     Rate and Rhythm: Normal rate and regular rhythm.  Pulmonary:     Effort: Pulmonary effort is normal.     Breath sounds: Normal breath sounds.  Abdominal:     General: Bowel sounds are normal. There is no distension.     Palpations: Abdomen is soft.     Tenderness: There is abdominal tenderness. There is no guarding or rebound.     Comments: Mild RUQ and LUQ tenderness to palpation  Skin:    General: Skin is warm and dry.  Neurological:     General: No focal deficit present.     Mental Status: She is alert. Mental status is at baseline.  Psychiatric:        Mood and Affect: Mood normal.        Behavior: Behavior normal.      No results found for any visits on 01/05/23.  Last CBC Lab Results  Component Value Date   WBC 12.7 (H) 12/06/2022   HGB 11.6 (L) 12/06/2022   HCT 35.5 (L) 12/06/2022   MCV 88.1 12/06/2022   MCH 28.8 12/06/2022   RDW 13.1 12/06/2022   PLT 232 12/06/2022   Last metabolic panel Lab Results  Component Value Date   GLUCOSE 87 12/04/2022   NA 137 12/04/2022   K 3.6 12/04/2022   CL 106 12/04/2022   CO2 23 12/04/2022   BUN 11 12/04/2022   CREATININE 0.72 12/04/2022   GFRNONAA >60 12/04/2022   CALCIUM 9.0 12/04/2022   PHOS 2.9 11/14/2022   PROT 7.6 12/04/2022    ALBUMIN 4.4 12/04/2022   LABGLOB 3.0 11/14/2022   AGRATIO 1.1 11/14/2022   BILITOT 1.7 (H) 12/04/2022   ALKPHOS 77 12/04/2022   AST 29 12/04/2022   ALT 33 12/04/2022   ANIONGAP 8 12/04/2022   Last lipids No results found for: "CHOL", "HDL", "LDLCALC", "  LDLDIRECT", "TRIG", "CHOLHDL" Last hemoglobin A1c No results found for: "HGBA1C" Last thyroid functions Lab Results  Component Value Date   TSH 1.410 11/13/2022   Last vitamin D Lab Results  Component Value Date   VD25OH 23.6 (L) 10/08/2019   Last vitamin B12 and Folate No results found for: "VITAMINB12", "FOLATE"    The ASCVD Risk score (Arnett DK, et al., 2019) failed to calculate for the following reasons:   The 2019 ASCVD risk score is only valid for ages 36 to 59    Assessment & Plan:   1. Hospital discharge follow-up/Non-traumatic rhabdomyolysis/Transaminitis/EBV infection: Discharged from [facility] 11/15/22, discharged from [facility] 11/20/22, notes, labs and imaging reviewed. Patient did not pick up Prednisone will hold off for now. Found to be EBV positive at [facility], will recheck CK, CMP today. Continue to push oral hydration.   - COMPLETE METABOLIC PANEL WITH GFR - CK (Creatine Kinase)  2. LUQ pain: Obtain abdominal US to evaluate spleen and liver as well.   - US Abdomen Complete; Future   No follow-ups on file.    [name], DO

## 2023-01-05 ENCOUNTER — Ambulatory Visit (LOCAL_COMMUNITY_HEALTH_CENTER): Payer: Self-pay

## 2023-01-05 ENCOUNTER — Ambulatory Visit: Payer: BC Managed Care – PPO | Admitting: Internal Medicine

## 2023-01-05 DIAGNOSIS — Z111 Encounter for screening for respiratory tuberculosis: Secondary | ICD-10-CM

## 2023-01-05 LAB — TB SKIN TEST
Induration: 3 mm
TB Skin Test: NEGATIVE

## 2023-01-15 NOTE — Progress Notes (Unsigned)
Established Patient Office Visit  Subjective   Patient ID: Heather Schmidt, female    DOB: 01/04/1996  Age: 27 y.o. MRN: [id number]  No chief complaint on file.   HPI  Patient is here today for follow up. Patient was admitted in May for rhabdomyolysis, transaminitis, potentially post-strep A myositis, EBV. Labs at the time showed CK >50,000, ESR 30, LDH 1300, platelets 307. CRP normal, ASO titer negative, ANA negative. LFT's elevated initially, trended down prior to discharge. US liver negative, acute hepatitis panel negative. She was discharged from [facility] on 11/15/22 and then went to [facility] for evaluation as well and was found to be positive for EBV. Repeat labs after discharge down-trending CK but mildly elevated leukocytes.  Abdominal US in June negative, chest x-ray also negative.   Patient Active Problem List   Diagnosis Date Noted   Elevated LFTs 11/14/2022   Rhabdomyolysis 11/13/2022   Transaminitis 11/13/2022   Infertility management 04/19/2022   HSV (herpes simplex virus) infection 04/19/2022   Secondary oligomenorrhea 03/31/2020   Eczema 03/31/2020   Generalized anxiety disorder 12/05/2019   Abnormal cervical Papanicolaou smear 10/31/2019   Vitamin D insufficiency 10/31/2019   Nonintractable episodic headache 10/31/2019   Depression 10/31/2019   History of anaphylaxis 10/31/2019   Narcolepsy 10/08/2019   Past Medical History:  Diagnosis Date   Asthma    Chlamydia 06/06/2019   COVID-19 06/23/2020   Depression    Narcolepsy    Past Surgical History:  Procedure Laterality Date   WISDOM TOOTH EXTRACTION     Social History   Tobacco Use   Smoking status: Never   Smokeless tobacco: Never  Vaping Use   Vaping status: Never Used  Substance Use Topics   Alcohol use: Yes    Comment: rarely   Drug use: Not Currently    Types: Marijuana   Social History   Socioeconomic History   Marital status: Media planner    Spouse name: Not on file   Number of  children: Not on file   Years of education: Not on file   Highest education level: Some college, no degree  Occupational History   Not on file  Tobacco Use   Smoking status: Never   Smokeless tobacco: Never  Vaping Use   Vaping status: Never Used  Substance and Sexual Activity   Alcohol use: Yes    Comment: rarely   Drug use: Not Currently    Types: Marijuana   Sexual activity: Yes    Birth control/protection: None  Other Topics Concern   Not on file  Social History Narrative   Not on file   Social Determinants of Health   Financial Resource Strain: Not on file  Food Insecurity: No Food Insecurity (11/14/2022)   Hunger Vital Sign    Worried About Running Out of Food in the Last Year: Never true    Ran Out of Food in the Last Year: Never true  Transportation Needs: No Transportation Needs (11/14/2022)   PRAPARE - Administrator, Civil Service (Medical): No    Lack of Transportation (Non-Medical): No  Physical Activity: Not on file  Stress: Not on file  Social Connections: Not on file  Intimate Partner Violence: Not At Risk (11/14/2022)   Humiliation, Afraid, Rape, and Kick questionnaire    Fear of Current or Ex-Partner: No    Emotionally Abused: No    Physically Abused: No    Sexually Abused: No   Family Status  Relation Name Status  Mother  Alive   Father  Alive   Sister  Alive   Brother  Alive   MGM  Alive   MGF  Deceased   PGM  Alive   PGF  Alive   Sister  Alive  No partnership data on file   Family History  Problem Relation Age of Onset   Asthma Mother    Thyroid disease Mother    Asthma Father    Cancer Maternal Grandmother    COPD Maternal Grandmother    Hypertension Paternal Grandmother    Hypertension Paternal Grandfather    Allergies  Allergen Reactions   Fish Allergy Anaphylaxis   Peanut-Containing Drug Products Anaphylaxis   Shellfish Allergy Anaphylaxis   Egg [Egg-Derived Products] Nausea And Vomiting   Other Cough    Ginger  and barley   Latex Rash      Review of Systems  Constitutional:  Negative for chills and fever.  HENT:  Negative for sore throat.   Respiratory:  Negative for cough and shortness of breath.   Cardiovascular:  Negative for chest pain.      Objective:     LMP 12/14/2022 (Within Days)  BP Readings from Last 3 Encounters:  12/06/22 106/70  12/04/22 116/68  11/30/22 108/62   Wt Readings from Last 3 Encounters:  12/06/22 129 lb 4.8 oz (58.7 kg)  12/04/22 129 lb 11.2 oz (58.8 kg)  11/30/22 129 lb 14.4 oz (58.9 kg)      Physical Exam Constitutional:      Appearance: Normal appearance.  HENT:     Head: Normocephalic and atraumatic.  Eyes:     Conjunctiva/sclera: Conjunctivae normal.  Cardiovascular:     Rate and Rhythm: Normal rate and regular rhythm.  Pulmonary:     Effort: Pulmonary effort is normal.     Breath sounds: Normal breath sounds.  Abdominal:     General: Bowel sounds are normal. There is no distension.     Palpations: Abdomen is soft.     Tenderness: There is abdominal tenderness. There is no guarding or rebound.     Comments: Mild RUQ and LUQ tenderness to palpation  Skin:    General: Skin is warm and dry.  Neurological:     General: No focal deficit present.     Mental Status: She is alert. Mental status is at baseline.  Psychiatric:        Mood and Affect: Mood normal.        Behavior: Behavior normal.      No results found for any visits on 01/16/23.  Last CBC Lab Results  Component Value Date   WBC 12.7 (H) 12/06/2022   HGB 11.6 (L) 12/06/2022   HCT 35.5 (L) 12/06/2022   MCV 88.1 12/06/2022   MCH 28.8 12/06/2022   RDW 13.1 12/06/2022   PLT 232 12/06/2022   Last metabolic panel Lab Results  Component Value Date   GLUCOSE 87 12/04/2022   NA 137 12/04/2022   K 3.6 12/04/2022   CL 106 12/04/2022   CO2 23 12/04/2022   BUN 11 12/04/2022   CREATININE 0.72 12/04/2022   GFRNONAA >60 12/04/2022   CALCIUM 9.0 12/04/2022   PHOS 2.9  11/14/2022   PROT 7.6 12/04/2022   ALBUMIN 4.4 12/04/2022   LABGLOB 3.0 11/14/2022   AGRATIO 1.1 11/14/2022   BILITOT 1.7 (H) 12/04/2022   ALKPHOS 77 12/04/2022   AST 29 12/04/2022   ALT 33 12/04/2022   ANIONGAP 8 12/04/2022   Last lipids No  results found for: "CHOL", "HDL", "LDLCALC", "LDLDIRECT", "TRIG", "CHOLHDL" Last hemoglobin A1c No results found for: "HGBA1C" Last thyroid functions Lab Results  Component Value Date   TSH 1.410 11/13/2022   Last vitamin D Lab Results  Component Value Date   VD25OH 23.6 (L) 10/08/2019   Last vitamin B12 and Folate No results found for: "VITAMINB12", "FOLATE"    The ASCVD Risk score (Arnett DK, et al., 2019) failed to calculate for the following reasons:   The 2019 ASCVD risk score is only valid for ages 34 to 34    Assessment & Plan:   1. Hospital discharge follow-up/Non-traumatic rhabdomyolysis/Transaminitis/EBV infection: Discharged from [facility] 11/15/22, discharged from [facility] 11/20/22, notes, labs and imaging reviewed. Patient did not pick up Prednisone will hold off for now. Found to be EBV positive at [facility], will recheck CK, CMP today. Continue to push oral hydration.   - COMPLETE METABOLIC PANEL WITH GFR - CK (Creatine Kinase)  2. LUQ pain: Obtain abdominal US to evaluate spleen and liver as well.   - US Abdomen Complete; Future   No follow-ups on file.    [name], DO

## 2023-01-16 ENCOUNTER — Ambulatory Visit (INDEPENDENT_AMBULATORY_CARE_PROVIDER_SITE_OTHER): Payer: BC Managed Care – PPO | Admitting: Internal Medicine

## 2023-01-16 ENCOUNTER — Encounter: Payer: Self-pay | Admitting: Internal Medicine

## 2023-01-16 VITALS — BP 98/62 | HR 84 | Temp 97.8°F | Resp 16 | Ht 62.0 in | Wt 126.7 lb

## 2023-01-16 DIAGNOSIS — K5900 Constipation, unspecified: Secondary | ICD-10-CM

## 2023-01-16 DIAGNOSIS — B2790 Infectious mononucleosis, unspecified without complication: Secondary | ICD-10-CM

## 2023-01-16 DIAGNOSIS — M25512 Pain in left shoulder: Secondary | ICD-10-CM | POA: Diagnosis not present

## 2023-01-16 MED ORDER — ALBUTEROL SULFATE HFA 108 (90 BASE) MCG/ACT IN AERS
2.0000 | INHALATION_SPRAY | Freq: Four times a day (QID) | RESPIRATORY_TRACT | 1 refills | Status: AC | PRN
Start: 2023-01-16 — End: ?

## 2023-01-16 NOTE — Patient Instructions (Addendum)
It was great seeing you today!  Plan discussed at today's visit: -Albuterol inhaler refilled -For constipation, recommend increasing fluid intake and starting a fiber supplement (be sure to start low and gradually increase fiber to avoid side effects). Also recommend Miralax starting at 1 capful daily and increase as needed with the goal to have one soft BM daily -For shoulder pain, let me know if it becomes more frequent and we will do an x-ray and possibly refer to PT, for now recommend anti-inflammatories as needed or OTC voltaren gel  Follow up in: as needed  Take care and let us know if you have any questions or concerns prior to your next visit.  Dr. Caralee Ates

## 2023-01-17 DIAGNOSIS — K130 Diseases of lips: Secondary | ICD-10-CM | POA: Diagnosis not present

## 2023-01-17 DIAGNOSIS — L2089 Other atopic dermatitis: Secondary | ICD-10-CM | POA: Diagnosis not present

## 2023-01-23 DIAGNOSIS — B2790 Infectious mononucleosis, unspecified without complication: Secondary | ICD-10-CM | POA: Diagnosis not present

## 2023-01-23 DIAGNOSIS — M2550 Pain in unspecified joint: Secondary | ICD-10-CM | POA: Diagnosis not present

## 2023-01-23 DIAGNOSIS — R748 Abnormal levels of other serum enzymes: Secondary | ICD-10-CM | POA: Diagnosis not present

## 2023-02-06 ENCOUNTER — Encounter: Payer: Self-pay | Admitting: Family Medicine

## 2023-02-06 ENCOUNTER — Ambulatory Visit (INDEPENDENT_AMBULATORY_CARE_PROVIDER_SITE_OTHER): Payer: BC Managed Care – PPO

## 2023-02-06 ENCOUNTER — Other Ambulatory Visit (HOSPITAL_COMMUNITY)
Admission: RE | Admit: 2023-02-06 | Discharge: 2023-02-06 | Disposition: A | Discharge status: Home / Self Care | Payer: BC Managed Care – PPO | Source: Ambulatory Visit | Attending: Obstetrics & Gynecology | Admitting: Obstetrics & Gynecology

## 2023-02-06 VITALS — BP 106/68 | HR 73

## 2023-02-06 DIAGNOSIS — B3731 Acute candidiasis of vulva and vagina: Secondary | ICD-10-CM | POA: Insufficient documentation

## 2023-02-06 DIAGNOSIS — N760 Acute vaginitis: Secondary | ICD-10-CM

## 2023-02-06 DIAGNOSIS — Z113 Encounter for screening for infections with a predominantly sexual mode of transmission: Secondary | ICD-10-CM | POA: Insufficient documentation

## 2023-02-06 DIAGNOSIS — B9689 Other specified bacterial agents as the cause of diseases classified elsewhere: Secondary | ICD-10-CM | POA: Diagnosis not present

## 2023-02-06 NOTE — Progress Notes (Signed)
SUBJECTIVE:  27 y.o. female who desires a STI screen. Denies abnormal vaginal discharge, bleeding or significant pelvic pain. No UTI symptoms. Denies history of known exposure to STD. Pt stating that she has just changed partners so wanted to be swabbed  No LMP recorded.  OBJECTIVE:  She appears well.   ASSESSMENT:  STI Screen   PLAN:  Pt offered STI blood screening-not indicated GC, chlamydia, and trichomonas probe sent to lab.  Treatment: To be determined once lab results are received.  Pt follow up as needed.

## 2023-02-08 MED ORDER — METRONIDAZOLE 500 MG PO TABS
500.0000 mg | ORAL_TABLET | Freq: Two times a day (BID) | ORAL | 0 refills | Status: AC
Start: 2023-02-08 — End: 2023-02-15

## 2023-02-08 MED ORDER — FLUCONAZOLE 150 MG PO TABS
150.0000 mg | ORAL_TABLET | Freq: Once | ORAL | 1 refills | Status: AC
Start: 2023-02-08 — End: 2023-02-08

## 2023-02-08 NOTE — Addendum Note (Signed)
Addended by: Jaynie Collins A on: 02/08/2023 06:29 AM   Modules accepted: Orders

## 2023-03-06 ENCOUNTER — Encounter: Payer: Self-pay | Admitting: Family Medicine

## 2023-03-06 ENCOUNTER — Ambulatory Visit (INDEPENDENT_AMBULATORY_CARE_PROVIDER_SITE_OTHER): Payer: BC Managed Care – PPO | Admitting: Family Medicine

## 2023-03-06 ENCOUNTER — Encounter: Payer: Self-pay | Admitting: Internal Medicine

## 2023-03-06 VITALS — BP 110/72 | HR 72 | Temp 97.7°F | Resp 16 | Ht 62.0 in | Wt 132.1 lb

## 2023-03-06 DIAGNOSIS — G47419 Narcolepsy without cataplexy: Secondary | ICD-10-CM | POA: Diagnosis not present

## 2023-03-06 DIAGNOSIS — J358 Other chronic diseases of tonsils and adenoids: Secondary | ICD-10-CM

## 2023-03-06 DIAGNOSIS — B2790 Infectious mononucleosis, unspecified without complication: Secondary | ICD-10-CM | POA: Diagnosis not present

## 2023-03-06 MED ORDER — LIDOCAINE VISCOUS HCL 2 % MT SOLN
OROMUCOSAL | 0 refills | Status: DC
Start: 2023-03-06 — End: 2023-03-08

## 2023-03-06 MED ORDER — PREDNISOLONE 15 MG/5ML PO SOLN
ORAL | 0 refills | Status: AC
Start: 2023-03-06 — End: ?

## 2023-03-06 NOTE — Progress Notes (Signed)
Patient ID: Heather Schmidt, female    DOB: 1996/04/05, 27 y.o.   MRN: [id number]  PCP: [name], DO  Chief Complaint  Patient presents with   Referral    ENT-right tonsil has been hurting and my neck has been hurting as well, but it doesn't feel like it's strep throat or anything but it's really sore and I can't clear the pockets anymore.     Subjective:   Heather Schmidt is a 27 y.o. female, presents to clinic with CC of the following:  HPI  Pt presents for continued pharyngitis with intermittent episodes of increased pain and inflammation  Secondary to acute illness about 4 months ago:  [facility] admission on 5/13, then [facility] 5/15 to 5/20: Non-traumatic rhabdomyolysis (POA: Yes) Active Problems: Elevated LFTs (POA: Yes) Leukocytosis (POA: Yes) Acute Epstein Barr virus (EBV) infection (POA: Yes)  Discharged summary: Heather Schmidt is a 27 y.o. F with history of asthma who presents with rhabdomyolysis suspected secondary to acute EBV infection.  Acute Epstein Barr Viral Infection - EBV IgM and IgG are both positive as well as EBV Nuclear IgG AB. This is consistent with acute EBV infection. Treatment at this time is supportive.   Rhabdomyolysis - Likely secondary to acute EBV infection versus recent strep infection (though question if this may have been viral prodrome to EBV) and possibly myositis. She was admitted 5/13-5/15 at [facility] and was started on steroids for possible myositis. Initially with CK to ~50k at [facility]. She presented back to [facility] on 5/15 after discharge. Steroids were not continued on admission. Fluids were continued. CK improved and her creatinine remained stable. By 5/20 patient had significant improvement in her symptoms. We will not continue steroids given clinical improvement. She will plan to follow up with her PCP, Jobe Igo, DO, in the next 1-2 weeks for re-evaluation. If she has recurrent symptoms, would recommend  further rheumatology evaluation for myositis.   She did come into PCP office several times the month following admission, fatigue, abd pain, sore throat and asthma exacerbatiosn were prodominant sx She did rheumatology f/up in July, CK returned to normal      Patient Active Problem List   Diagnosis Date Noted   Elevated LFTs 11/14/2022   Rhabdomyolysis 11/13/2022   Transaminitis 11/13/2022   Infertility management 04/19/2022   HSV (herpes simplex virus) infection 04/19/2022   Secondary oligomenorrhea 03/31/2020   Eczema 03/31/2020   Generalized anxiety disorder 12/05/2019   Abnormal cervical Papanicolaou smear 10/31/2019   Vitamin D insufficiency 10/31/2019   Nonintractable episodic headache 10/31/2019   Depression 10/31/2019   History of anaphylaxis 10/31/2019   Narcolepsy 10/08/2019      Current Outpatient Medications:    albuterol (VENTOLIN HFA) 108 (90 Base) MCG/ACT inhaler, Inhale 2 puffs into the lungs every 6 (six) hours as needed for wheezing., Disp: 1 each, Rfl: 1   Allergies  Allergen Reactions   Fish Allergy Anaphylaxis   Peanut-Containing Drug Products Anaphylaxis   Shellfish Allergy Anaphylaxis   Egg [Egg-Derived Products] Nausea And Vomiting   Other Cough    Ginger and barley   Latex Rash     Social History   Tobacco Use   Smoking status: Never   Smokeless tobacco: Never  Vaping Use   Vaping status: Never Used  Substance Use Topics   Alcohol use: Yes    Comment: rarely   Drug use: Not Currently    Types: Marijuana      Chart Review Today:  I personally reviewed active problem list, medication list, allergies, family history, social history, health maintenance, notes from last encounter, lab results, imaging with the patient/caregiver today.   Review of Systems  Constitutional: Negative.   HENT: Negative.    Eyes: Negative.   Respiratory: Negative.    Cardiovascular: Negative.   Gastrointestinal: Negative.   Endocrine: Negative.    Genitourinary: Negative.   Musculoskeletal: Negative.   Skin: Negative.   Allergic/Immunologic: Negative.   Neurological: Negative.   Hematological: Negative.   Psychiatric/Behavioral: Negative.    All other systems reviewed and are negative.      Objective:   Vitals:   03/06/23 1322  BP: 110/72  Pulse: 72  Resp: 16  Temp: 97.7 F (36.5 C)  TempSrc: Oral  SpO2: 99%  Weight: 132 lb 1.6 oz (59.9 kg)  Height: 5\' 2"  (1.575 m)    Body mass index is 24.16 kg/m.  Physical Exam Vitals and nursing note reviewed.  Constitutional:      Appearance: She is well-developed.  HENT:     Head: Normocephalic and atraumatic.     Nose: Nose normal.     Mouth/Throat:     Mouth: Mucous membranes are moist.     Pharynx: Uvula midline. Posterior oropharyngeal erythema present.     Tonsils: 2+ on the right. 2+ on the left.     Comments: Right more erythematous than left Right tonsillar and upper cervical lymphadenopathy  Eyes:     General:        Right eye: No discharge.        Left eye: No discharge.     Conjunctiva/sclera: Conjunctivae normal.  Neck:     Trachea: No tracheal deviation.  Cardiovascular:     Rate and Rhythm: Normal rate and regular rhythm.  Pulmonary:     Effort: Pulmonary effort is normal. No respiratory distress.     Breath sounds: No stridor.  Musculoskeletal:        General: Normal range of motion.  Skin:    General: Skin is warm and dry.     Findings: No rash.  Neurological:     Mental Status: She is alert.     Motor: No abnormal muscle tone.     Coordination: Coordination normal.  Psychiatric:        Behavior: Behavior normal.      Results for orders placed or performed in visit on 02/06/23  Cervicovaginal ancillary only( )  Result Value Ref Range   Neisseria Gonorrhea Negative    Chlamydia Negative    Trichomonas Negative    Bacterial Vaginitis (gardnerella) Positive (A)    Candida Vaginitis Positive (A)    Candida Glabrata  Negative    Comment      Normal Reference Range Bacterial Vaginosis - Negative   Comment Normal Reference Range Candida Species - Negative    Comment Normal Reference Range Candida Galbrata - Negative    Comment Normal Reference Range Trichomonas - Negative    Comment Normal Reference Ranger Chlamydia - Negative    Comment      Normal Reference Range Neisseria Gonorrhea - Negative       Assessment & Plan:     ICD-10-CM   1. Pharyngitis due to infectious mononucleosis  B27.90 Ambulatory referral to ENT    DISCONTINUED: lidocaine (XYLOCAINE) 2 % solution    DISCONTINUED: prednisoLONE (PRELONE) 15 MG/5ML SOLN    2. Tonsillith  J35.8 Ambulatory referral to ENT    DISCONTINUED: lidocaine (XYLOCAINE) 2 %  solution    DISCONTINUED: prednisoLONE (PRELONE) 15 MG/5ML SOLN    3. Primary narcolepsy without cataplexy  G47.419 Ambulatory referral to Neurology   per pulm/DR. Vassie Loll, she wishes for neuro consult/eval          Danelle Berry, PA-C 03/06/23 1:29 PM

## 2023-03-06 NOTE — Patient Instructions (Addendum)
I will send you instructions on how to make your won magic mouth wash that you can use 3 to 4 x a day to swish/gargle and swallow   Get mylanta  and childrens benadryl solution  You will add 50 mL to 50 mL childrens benadryl solution (12.5 mg per 5 mL) and add the 50 mL lidocaine and 50 mL steroids solution.  Also get and add 50 mL sterile water, combine and shake well You can use 5 mLs to gargle thoroughly, swish around your mouth and throat and SPIT out (sorry I misspoke - do not swallow it)  You can do this up to 4 x a day, especially before eating or meals.  Hope it helps

## 2023-03-08 ENCOUNTER — Other Ambulatory Visit: Payer: Self-pay

## 2023-03-08 DIAGNOSIS — B2790 Infectious mononucleosis, unspecified without complication: Secondary | ICD-10-CM

## 2023-03-08 DIAGNOSIS — J358 Other chronic diseases of tonsils and adenoids: Secondary | ICD-10-CM

## 2023-03-08 MED ORDER — LIDOCAINE VISCOUS HCL 2 % MT SOLN
OROMUCOSAL | 0 refills | Status: DC
Start: 2023-03-08 — End: 2023-05-18

## 2023-03-08 MED ORDER — PREDNISOLONE 15 MG/5ML PO SOLN
ORAL | 0 refills | Status: DC
Start: 2023-03-08 — End: 2023-05-11

## 2023-03-08 NOTE — Telephone Encounter (Signed)
Per pt pharmacy has not received medication will resend today

## 2023-03-26 DIAGNOSIS — R748 Abnormal levels of other serum enzymes: Secondary | ICD-10-CM | POA: Diagnosis not present

## 2023-03-28 ENCOUNTER — Other Ambulatory Visit (HOSPITAL_COMMUNITY)
Admission: RE | Admit: 2023-03-28 | Discharge: 2023-03-28 | Disposition: A | Discharge status: Home / Self Care | Payer: BC Managed Care – PPO | Source: Ambulatory Visit | Attending: Family Medicine | Admitting: Family Medicine

## 2023-03-28 ENCOUNTER — Ambulatory Visit (INDEPENDENT_AMBULATORY_CARE_PROVIDER_SITE_OTHER): Payer: BC Managed Care – PPO

## 2023-03-28 DIAGNOSIS — Z113 Encounter for screening for infections with a predominantly sexual mode of transmission: Secondary | ICD-10-CM

## 2023-03-28 DIAGNOSIS — B9689 Other specified bacterial agents as the cause of diseases classified elsewhere: Secondary | ICD-10-CM | POA: Insufficient documentation

## 2023-03-28 DIAGNOSIS — N760 Acute vaginitis: Secondary | ICD-10-CM | POA: Diagnosis not present

## 2023-03-28 NOTE — Progress Notes (Signed)
SUBJECTIVE:  27 y.o. female who desires a STI screen.   -Does note vaginal discharge   Denies  bleeding or significant pelvic pain. No UTI symptoms. Denies history of known exposure to STD. Pt requesting to be tested for "everything"     Patient's last menstrual period was 02/13/2023 (exact date).  OBJECTIVE:  She appears well.   ASSESSMENT:  STI Screen   PLAN:  Pt offered STI blood screening-requested GC, chlamydia, and trichomonas probe sent to lab.  Treatment: To be determined once lab results are received.  Pt follow up as needed.

## 2023-03-29 LAB — RPR+HBSAG+HCVAB+...
HIV Screen 4th Generation wRfx: NONREACTIVE
Hep C Virus Ab: NONREACTIVE
Hepatitis B Surface Ag: NEGATIVE
RPR Ser Ql: NONREACTIVE

## 2023-03-30 LAB — CERVICOVAGINAL ANCILLARY ONLY
Bacterial Vaginitis (gardnerella): POSITIVE — AB
Candida Glabrata: NEGATIVE
Candida Vaginitis: NEGATIVE
Chlamydia: NEGATIVE
Comment: NEGATIVE
Comment: NEGATIVE
Comment: NEGATIVE
Comment: NEGATIVE
Comment: NEGATIVE
Comment: NORMAL
Neisseria Gonorrhea: NEGATIVE
Trichomonas: NEGATIVE

## 2023-04-01 MED ORDER — METRONIDAZOLE 500 MG PO TABS: 500.0000 mg | ORAL_TABLET | Freq: Two times a day (BID) | ORAL | 0 refills | Status: AC

## 2023-04-01 NOTE — Addendum Note (Signed)
Addended by: Reva Bores on: 04/01/2023 09:23 AM   Modules accepted: Orders

## 2023-04-04 ENCOUNTER — Encounter: Payer: Self-pay | Admitting: Internal Medicine

## 2023-04-11 DIAGNOSIS — G47419 Narcolepsy without cataplexy: Secondary | ICD-10-CM | POA: Diagnosis not present

## 2023-04-12 ENCOUNTER — Encounter: Payer: Self-pay | Admitting: Nurse Practitioner

## 2023-04-12 ENCOUNTER — Ambulatory Visit (INDEPENDENT_AMBULATORY_CARE_PROVIDER_SITE_OTHER): Payer: BC Managed Care – PPO | Admitting: Nurse Practitioner

## 2023-04-12 ENCOUNTER — Other Ambulatory Visit: Payer: Self-pay

## 2023-04-12 VITALS — BP 112/72 | HR 103 | Temp 98.7°F | Resp 18 | Ht 62.0 in | Wt 130.6 lb

## 2023-04-12 DIAGNOSIS — R051 Acute cough: Secondary | ICD-10-CM

## 2023-04-12 DIAGNOSIS — J0140 Acute pansinusitis, unspecified: Secondary | ICD-10-CM | POA: Diagnosis not present

## 2023-04-12 MED ORDER — PROMETHAZINE-DM 6.25-15 MG/5ML PO SYRP: 5.0000 mL | ORAL_SOLUTION | Freq: Four times a day (QID) | ORAL | 0 refills | Status: DC | PRN

## 2023-04-12 MED ORDER — AMOXICILLIN-POT CLAVULANATE 875-125 MG PO TABS
1.0000 | ORAL_TABLET | Freq: Two times a day (BID) | ORAL | 0 refills | Status: DC
Start: 2023-04-12 — End: 2023-05-11

## 2023-04-12 MED ORDER — BENZONATATE 100 MG PO CAPS
200.0000 mg | ORAL_CAPSULE | Freq: Two times a day (BID) | ORAL | 0 refills | Status: DC | PRN
Start: 2023-04-12 — End: 2023-05-11

## 2023-04-12 NOTE — Progress Notes (Signed)
BP 112/72   Pulse (!) 103   Temp 98.7 F (37.1 C) (Oral)   Resp 18   Ht 5\' 2"  (1.575 m)   Wt 130 lb 9.6 oz (59.2 kg)   SpO2 95%   BMI 23.89 kg/m    Subjective:    Patient ID: Heather Schmidt, female    DOB: 1996/04/19, 27 y.o.   MRN: [id number]  HPI: Heather Schmidt is a 27 y.o. female  Chief Complaint  Patient presents with   Cough    Congested, headache for 3 weeks   Sinus infection Compliant: symptoms started 3 weeks ago -Fever: no -Cough: yes -Shortness of breath: a little bit -Wheezing: no -Chest congestion: yes -Nasal congestion: yes -Runny nose: yes -Post nasal drip: yes -Sore throat: no -Sinus pressure: yes -Headache: yes -Face pain: yes -Ear pain:  no -Ear pressure: no  -Relief with OTC cold/cough medications: mucinex, no relief, took one allergy pill last night  Recommend taking zyrtec, flonase, mucinex, vitamin d, vitamin c, and zinc. Push fluids and get rest.    Will send in augmentin to treat sinus infection, phenergan-dm and tessalon perls for cough.   Relevant past medical, surgical, family and social history reviewed and updated as indicated. Interim medical history since our last visit reviewed. Allergies and medications reviewed and updated.  Review of Systems  Ten systems reviewed and is negative except as mentioned in HPI       Objective:    BP 112/72   Pulse (!) 103   Temp 98.7 F (37.1 C) (Oral)   Resp 18   Ht 5\' 2"  (1.575 m)   Wt 130 lb 9.6 oz (59.2 kg)   SpO2 95%   BMI 23.89 kg/m   Wt Readings from Last 3 Encounters:  04/12/23 130 lb 9.6 oz (59.2 kg)  03/06/23 132 lb 1.6 oz (59.9 kg)  01/16/23 126 lb 11.2 oz (57.5 kg)    Physical Exam  Constitutional: Patient appears well-developed and well-nourished. No distress.  HEENT: head atraumatic, normocephalic, pupils equal and reactive to light, ears Tms clear, neck supple, throat within normal limits, facial tenderness Cardiovascular: Normal rate, regular rhythm  and normal heart sounds.  No murmur heard. No BLE edema. Pulmonary/Chest: Effort normal and breath sounds normal. No respiratory distress. Abdominal: Soft.  There is no tenderness. Psychiatric: Patient has a normal mood and affect. behavior is normal. Judgment and thought content normal.  Results for orders placed or performed in visit on 03/28/23  RPR+HBsAg+HCVAb+...  Result Value Ref Range   Hepatitis B Surface Ag Negative Negative   Hep C Virus Ab Non Reactive Non Reactive   RPR Ser Ql Non Reactive Non Reactive   HIV Screen 4th Generation wRfx Non Reactive Non Reactive  Cervicovaginal ancillary only  Result Value Ref Range   Neisseria Gonorrhea Negative    Chlamydia Negative    Trichomonas Negative    Bacterial Vaginitis (gardnerella) Positive (A)    Candida Vaginitis Negative    Candida Glabrata Negative    Comment      Normal Reference Range Bacterial Vaginosis - Negative   Comment Normal Reference Range Candida Species - Negative    Comment Normal Reference Range Candida Galbrata - Negative    Comment Normal Reference Range Trichomonas - Negative    Comment Normal Reference Ranger Chlamydia - Negative    Comment      Normal Reference Range Neisseria Gonorrhea - Negative      Assessment & Plan:   Problem  List Items Addressed This Visit   None Visit Diagnoses     Acute non-recurrent pansinusitis    -  Primary   Will send in augmentin to treat sinus infection, phenergan-dm and tessalon perls for cough. push fluids, can also take zyrtec, flonase and mucinex   Relevant Medications   amoxicillin-clavulanate (AUGMENTIN) 875-125 MG tablet   benzonatate (TESSALON) 100 MG capsule   promethazine-dextromethorphan (PROMETHAZINE-DM) 6.25-15 MG/5ML syrup   Acute cough       Will send in augmentin to treat sinus infection, phenergan-dm and tessalon perls for cough. push fluids, can also take zyrtec, flonase and mucinex   Relevant Medications   benzonatate (TESSALON) 100 MG capsule    promethazine-dextromethorphan (PROMETHAZINE-DM) 6.25-15 MG/5ML syrup        Follow up plan: Return if symptoms worsen or fail to improve.

## 2023-04-17 ENCOUNTER — Telehealth: Payer: BC Managed Care – PPO | Admitting: Physician Assistant

## 2023-04-17 DIAGNOSIS — A084 Viral intestinal infection, unspecified: Secondary | ICD-10-CM | POA: Diagnosis not present

## 2023-04-17 MED ORDER — ONDANSETRON HCL 4 MG PO TABS: 4.0000 mg | ORAL_TABLET | Freq: Three times a day (TID) | ORAL | 0 refills | Status: AC | PRN

## 2023-04-17 NOTE — Progress Notes (Signed)
I have spent 5 minutes in review of e-visit questionnaire, review and updating patient chart, medical decision making and response to patient.   Mia Milan Cody Jacklynn Dehaas, PA-C    

## 2023-04-17 NOTE — Progress Notes (Signed)

## 2023-04-18 ENCOUNTER — Institutional Professional Consult (permissible substitution) (INDEPENDENT_AMBULATORY_CARE_PROVIDER_SITE_OTHER): Payer: BC Managed Care – PPO

## 2023-04-22 DIAGNOSIS — R509 Fever, unspecified: Secondary | ICD-10-CM | POA: Diagnosis not present

## 2023-04-22 DIAGNOSIS — R059 Cough, unspecified: Secondary | ICD-10-CM | POA: Diagnosis not present

## 2023-04-22 DIAGNOSIS — Z20822 Contact with and (suspected) exposure to covid-19: Secondary | ICD-10-CM | POA: Diagnosis not present

## 2023-04-22 DIAGNOSIS — J45909 Unspecified asthma, uncomplicated: Secondary | ICD-10-CM | POA: Diagnosis not present

## 2023-04-22 DIAGNOSIS — J45901 Unspecified asthma with (acute) exacerbation: Secondary | ICD-10-CM | POA: Diagnosis not present

## 2023-04-22 DIAGNOSIS — R0602 Shortness of breath: Secondary | ICD-10-CM | POA: Diagnosis not present

## 2023-04-23 ENCOUNTER — Encounter: Payer: Self-pay | Admitting: Family Medicine

## 2023-04-27 ENCOUNTER — Encounter (INDEPENDENT_AMBULATORY_CARE_PROVIDER_SITE_OTHER): Payer: Self-pay

## 2023-04-27 ENCOUNTER — Ambulatory Visit (INDEPENDENT_AMBULATORY_CARE_PROVIDER_SITE_OTHER): Payer: BC Managed Care – PPO

## 2023-04-27 VITALS — Ht 62.0 in | Wt 130.0 lb

## 2023-04-27 DIAGNOSIS — J3501 Chronic tonsillitis: Secondary | ICD-10-CM | POA: Diagnosis not present

## 2023-04-27 DIAGNOSIS — J358 Other chronic diseases of tonsils and adenoids: Secondary | ICD-10-CM | POA: Diagnosis not present

## 2023-04-27 NOTE — Progress Notes (Unsigned)
Dear Dr. Angelica Chessman, Here is my assessment for our mutual patient, Heather Schmidt. Thank you for allowing me the opportunity to care for your patient. Please do not hesitate to contact me should you have any other questions. Sincerely, Dr. Jovita Kussmaul  Otolaryngology Clinic Note Referring provider: Dr. Angelica Chessman HPI:  Heather Schmidt is a 27 y.o. female kindly referred by Dr. Angelica Chessman for evaluation of tonsil stones. 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 Every 2-3 months, she will have big stones, tonsils will bleed and will have throat pain. It is unclear why the tonsils bleed - maybe about a teaspoon. She does not get antibiotics for it. Last time was prescribed Magic mouthwash but could not get it. She has not gotten any abx by mouth.  See below for complicated hospital stay regarding Mono. She was on extended course of Prednisone.  No PTAs, snores some but no apneas  She had strep in May 2024 and then had EBV in Sept  No cough congestion or problems breathing. She recently had trouble with wheezing this past week, and diagnosed with asthma exacerbation. She had augmentin oct 10 for sinusitis and cough.  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  PMHx: Asthma, h/o Rhabdomyolysis, Transamnitis, Narcolepsy H&N Surgery: *** Personal or FHx of bleeding dz or anesthesia difficulty: no ***  Independent Review of Additional Tests or Records:  ***   PMH/Meds/All/SocHx/FamHx/ROS:   Past Medical History:  Diagnosis Date   Asthma    Chlamydia 06/06/2019   COVID-19 06/23/2020   Depression    Narcolepsy      Past Surgical History:  Procedure Laterality Date   WISDOM TOOTH EXTRACTION      Family History  Problem Relation Age of Onset   Asthma Mother    Thyroid disease Mother    Asthma Father    Cancer  Maternal Grandmother    COPD Maternal Grandmother    Hypertension Paternal Grandmother    Hypertension Paternal Grandfather      Social Connections: Not on file    Tobacco: ***. Alcohol: ***. Occupation: ***. Lives in *** with ***.   Current Outpatient Medications:    albuterol (VENTOLIN HFA) 108 (90 Base) MCG/ACT inhaler, Inhale 2 puffs into the lungs every 6 (six) hours as needed for wheezing., Disp: 1 each, Rfl: 1   amoxicillin-clavulanate (AUGMENTIN) 875-125 MG tablet, Take 1 tablet by mouth 2 (two) times daily., Disp: 20 tablet, Rfl: 0   benzonatate (TESSALON) 100 MG capsule, Take 2 capsules (200 mg total) by mouth 2 (two) times daily as needed for cough., Disp: 20 capsule, Rfl: 0   lidocaine (XYLOCAINE) 2 % solution, To be used in magic mouth wash solution in divided doses (total solution 250 mL, use swish, gargle, and spit QID prn for mouth/throat pain) (Patient not taking: Reported on 04/12/2023), Disp: 50 mL, Rfl: 0   ondansetron (ZOFRAN) 4 MG tablet, Take 1 tablet (4 mg total) by mouth every 8 (eight) hours as needed for nausea or vomiting., Disp: 20 tablet, Rfl: 0   prednisoLONE (PRELONE) 15 MG/5ML SOLN, To be used in magic mouth wash solution in divided doses (total solution 250 mL, use swish, gargle, and spit QID prn for mouth/throat pain) (Patient not taking: Reported on 04/12/2023), Disp: 50 mL, Rfl: 0   promethazine-dextromethorphan (  PROMETHAZINE-DM) 6.25-15 MG/5ML syrup, Take 5 mLs by mouth 4 (four) times daily as needed for cough., Disp: 118 mL, Rfl: 0   Physical Exam:   There were no vitals taken for this visit. ***  Salient findings:  CN II-XII intact *** Bilateral EAC clear and TM intact with well pneumatized middle ear spaces Weber 512: *** Rinne 512: AC > BC b/l *** Rine 1024: AC > BC b/l *** Anterior rhinoscopy: Septum ***; bilateral inferior turbinates with *** No lesions of oral cavity/oropharynx; dentition *** No obviously palpable neck  masses/lymphadenopathy/thyromegaly No respiratory distress or stridor***  Procedures:  None***  Impression & Plans:  Heather Schmidt is a 27 y.o. female with ***   - f/u ***   Thank you for allowing me the opportunity to care for your patient. Please do not hesitate to contact me should you have any other questions.  Sincerely, Jovita Kussmaul, MD Otolarynoglogist (ENT), [facility] Phone: [phone] Fax: [phone]  04/27/2023, 3:10 PM

## 2023-04-28 ENCOUNTER — Encounter (INDEPENDENT_AMBULATORY_CARE_PROVIDER_SITE_OTHER): Payer: Self-pay | Admitting: Otolaryngology

## 2023-04-30 ENCOUNTER — Institutional Professional Consult (permissible substitution) (INDEPENDENT_AMBULATORY_CARE_PROVIDER_SITE_OTHER): Payer: BC Managed Care – PPO | Admitting: Otolaryngology

## 2023-05-09 ENCOUNTER — Ambulatory Visit: Payer: BC Managed Care – PPO | Admitting: Internal Medicine

## 2023-05-09 NOTE — Progress Notes (Deleted)
   Established Patient Office Visit  Subjective   Patient ID: Heather Schmidt, female    DOB: September 06, 1995  Age: 27 y.o. MRN: [id number]  No chief complaint on file.   HPI  Patient is here for ER follow up.  Discharge Date: 04/22/23 Hospital/facility: [facility]  Diagnosis: asthma exacerbation  Procedures/tests: chest x-ray negative, viral panel negative New medications: Treated with Duonebs, Prednisone  Discontinued medications: None Status: {Blank multiple:19196::"better","worse","stable","fluctuating"}   {History (Optional):23778}  ROS    Objective:     LMP 04/20/2023 (Approximate)  {Vitals History (Optional):23777}  Physical Exam   No results found for any visits on 05/10/23.  {Labs (Optional):23779}  The ASCVD Risk score (Arnett DK, et al., 2019) failed to calculate for the following reasons:   The 2019 ASCVD risk score is only valid for ages 54 to 74    Assessment & Plan:   Problem List Items Addressed This Visit   None   No follow-ups on file.    [name], DO

## 2023-05-10 ENCOUNTER — Ambulatory Visit: Payer: BC Managed Care – PPO | Admitting: Internal Medicine

## 2023-05-11 ENCOUNTER — Encounter: Payer: Self-pay | Admitting: Physician Assistant

## 2023-05-11 ENCOUNTER — Ambulatory Visit (INDEPENDENT_AMBULATORY_CARE_PROVIDER_SITE_OTHER): Payer: BC Managed Care – PPO | Admitting: Physician Assistant

## 2023-05-11 VITALS — BP 104/74 | HR 99 | Temp 98.5°F | Resp 16 | Ht 62.0 in | Wt 129.0 lb

## 2023-05-11 DIAGNOSIS — R051 Acute cough: Secondary | ICD-10-CM | POA: Diagnosis not present

## 2023-05-11 DIAGNOSIS — J4521 Mild intermittent asthma with (acute) exacerbation: Secondary | ICD-10-CM | POA: Insufficient documentation

## 2023-05-11 DIAGNOSIS — L279 Dermatitis due to unspecified substance taken internally: Secondary | ICD-10-CM | POA: Diagnosis not present

## 2023-05-11 MED ORDER — NYSTATIN 100000 UNIT/ML MT SUSP
5.0000 mL | Freq: Four times a day (QID) | OROMUCOSAL | 0 refills | Status: AC
Start: 2023-05-11 — End: ?

## 2023-05-11 MED ORDER — BREO ELLIPTA 50-25 MCG/INH IN AEPB
1.0000 | INHALATION_SPRAY | Freq: Every day | RESPIRATORY_TRACT | 1 refills | Status: AC
Start: 2023-05-11 — End: ?

## 2023-05-11 MED ORDER — AIRSUPRA 90-80 MCG/ACT IN AERO
2.0000 | INHALATION_SPRAY | Freq: Four times a day (QID) | RESPIRATORY_TRACT | 2 refills | Status: AC | PRN
Start: 2023-05-11 — End: ?

## 2023-05-11 MED ORDER — BENZONATATE 100 MG PO CAPS
200.0000 mg | ORAL_CAPSULE | Freq: Two times a day (BID) | ORAL | 0 refills | Status: DC | PRN
Start: 2023-05-11 — End: 2023-06-19

## 2023-05-11 NOTE — Progress Notes (Signed)
Established Patient Office Visit  Subjective   Patient ID: Heather Schmidt, female    DOB: 02/15/1996  Age: 27 y.o. MRN: [id number]  Chief Complaint  Patient presents with   ER Follow up   Asthma   Cough   Wheezing    HPI  Patient is here for ER follow up.  Discharge Date: 04/22/23 Hospital/facility: [facility]  Diagnosis: asthma exacerbation  Procedures/tests: chest x-ray negative, viral panel negative New medications: Treated with Duonebs, Prednisone  Discontinued medications: None Status:  she reports she is feeling about the same as when she was seen in the ED   She reports some improvement while on the Steroid taper  She is using her rescue inhaler every 2-3 hours       Review of Systems  Respiratory:  Positive for cough, shortness of breath and wheezing.   Cardiovascular:  Negative for chest pain and palpitations.  Neurological:  Negative for dizziness and headaches.      Objective:     BP 104/74   Pulse 99   Temp 98.5 F (36.9 C) (Oral)   Resp 16   Ht 5\' 2"  (1.575 m)   Wt 129 lb (58.5 kg)   LMP 04/20/2023 (Approximate)   SpO2 97%   BMI 23.59 kg/m    Physical Exam Vitals reviewed.  Constitutional:      General: She is awake.     Appearance: Normal appearance. She is well-developed and well-groomed.  HENT:     Head: Normocephalic and atraumatic.     Mouth/Throat:     Lips: Pink.     Mouth: Mucous membranes are dry. No lacerations or oral lesions.     Pharynx: Uvula midline.     Comments: Mild peri-oral dryness  Cardiovascular:     Rate and Rhythm: Normal rate and regular rhythm.     Pulses: Normal pulses.          Radial pulses are 2+ on the right side and 2+ on the left side.     Heart sounds: Normal heart sounds. No murmur heard.    No friction rub. No gallop.  Pulmonary:     Effort: Pulmonary effort is normal.     Breath sounds: No decreased air movement. Wheezing present. No decreased breath sounds, rhonchi or rales.      Comments: Mild wheezing in upper lung fields  Chest sounds a bit tight but she is moving air pretty well globally  Musculoskeletal:     Right lower leg: No edema.     Left lower leg: No edema.  Neurological:     Mental Status: She is alert.  Psychiatric:        Behavior: Behavior is cooperative.      No results found for any visits on 05/11/23.    The ASCVD Risk score (Arnett DK, et al., 2019) failed to calculate for the following reasons:   The 2019 ASCVD risk score is only valid for ages 97 to 79    Assessment & Plan:   Problem List Items Addressed This Visit       Respiratory   Mild intermittent asthma with acute exacerbation - Primary    Acute, new concerns Patient was recently seen at the emergency room on 04/22/2023-discharge notes, labs, imaging results reviewed. She reports some improvement while on steroid taper but states that her breathing and coughing has returned to severity.  She states that she is having to use her rescue inhaler every 2-3 hours Physical exam  is notable for mild wheezing but she is moving adequate air and O2 sats are normal today in office Will bump up her treatment and place her on Breo once a day.  I have also sent in a prescription for Airsupra to replace her rescue inhaler If symptoms are not improving with this management might need to add triple therapy and home nebulizer Reviewed ED and follow-up precautions Follow-up as needed      Relevant Medications   Albuterol-Budesonide (AIRSUPRA) 90-80 MCG/ACT AERO   Fluticasone Furoate-Vilanterol (BREO ELLIPTA) 50-25 MCG/ACT AEPB   Other Visit Diagnoses     Acute cough       Relevant Medications   benzonatate (TESSALON) 100 MG capsule   Localized dermatitis due to substance taken via oral route       Relevant Medications   nystatin (MYCOSTATIN) 100000 UNIT/ML suspension       No follow-ups on file.    Adelae Yodice E Lu Paradise, PA-C

## 2023-05-11 NOTE — Patient Instructions (Addendum)
I also recommend adding an antihistamine to your daily regimen This includes medications like Claritin, Allegra, Zyrtec- the generics of these work very well and are usually less expensive I recommend using Flonase nasal spray - 2 puffs twice per day to help with your nasal congestion The antihistamines and Flonase can take a few weeks to provide significant relief from allergy symptoms but should start to provide some benefit soon.  I have sent ina script for a daily inhaler that I would like you to take twice per day until you are feeling better. Once you are feeling a bit better we can reduce this to once per day then you can usually return to your rescue inhaler as needed

## 2023-05-11 NOTE — Assessment & Plan Note (Signed)
Acute, new concerns Patient was recently seen at the emergency room on 04/22/2023-discharge notes, labs, imaging results reviewed. She reports some improvement while on steroid taper but states that her breathing and coughing has returned to severity.  She states that she is having to use her rescue inhaler every 2-3 hours Physical exam is notable for mild wheezing but she is moving adequate air and O2 sats are normal today in office Will bump up her treatment and place her on Breo once a day.  I have also sent in a prescription for Airsupra to replace her rescue inhaler If symptoms are not improving with this management might need to add triple therapy and home nebulizer Reviewed ED and follow-up precautions Follow-up as needed

## 2023-05-18 ENCOUNTER — Ambulatory Visit (INDEPENDENT_AMBULATORY_CARE_PROVIDER_SITE_OTHER): Payer: BC Managed Care – PPO

## 2023-05-18 ENCOUNTER — Other Ambulatory Visit (HOSPITAL_COMMUNITY)
Admission: RE | Admit: 2023-05-18 | Discharge: 2023-05-18 | Disposition: A | Discharge status: Home / Self Care | Payer: BC Managed Care – PPO | Source: Ambulatory Visit | Attending: Family Medicine | Admitting: Family Medicine

## 2023-05-18 DIAGNOSIS — Z113 Encounter for screening for infections with a predominantly sexual mode of transmission: Secondary | ICD-10-CM | POA: Diagnosis not present

## 2023-05-18 DIAGNOSIS — N390 Urinary tract infection, site not specified: Secondary | ICD-10-CM | POA: Diagnosis not present

## 2023-05-18 DIAGNOSIS — R399 Unspecified symptoms and signs involving the genitourinary system: Secondary | ICD-10-CM

## 2023-05-18 DIAGNOSIS — R300 Dysuria: Secondary | ICD-10-CM | POA: Diagnosis not present

## 2023-05-18 LAB — POCT URINALYSIS DIPSTICK

## 2023-05-18 MED ORDER — NITROFURANTOIN MONOHYD MACRO 100 MG PO CAPS: 100.0000 mg | ORAL_CAPSULE | Freq: Two times a day (BID) | ORAL | 0 refills | Status: AC

## 2023-05-18 NOTE — Progress Notes (Addendum)
SUBJECTIVE:  27 y.o. female complains of thin vaginal discharge for 1-2 day(s) and dysuria and urinary urgency Denies abnormal vaginal bleeding or significant pelvic pain or fever.Denies history of known exposure to STD.  Patient's last menstrual period was 04/20/2023 (approximate).  OBJECTIVE:  She appears alert, well appearing, in no apparent distress Urine dipstick: positive for leukocytes.  ASSESSMENT:  Vaginal Discharge  Vaginal Odor Dysuria    PLAN:  GC, chlamydia, trichomonas, BVAG, CVAG probe and urine culture sent to lab. Treatment: To be determined once lab results are received ROV prn if symptoms persist or worsen.

## 2023-05-21 LAB — CERVICOVAGINAL ANCILLARY ONLY
Bacterial Vaginitis (gardnerella): NEGATIVE
Candida Glabrata: NEGATIVE
Candida Vaginitis: POSITIVE — AB
Chlamydia: NEGATIVE
Comment: NEGATIVE
Comment: NEGATIVE
Comment: NEGATIVE
Comment: NEGATIVE
Comment: NEGATIVE
Comment: NORMAL
Neisseria Gonorrhea: NEGATIVE
Trichomonas: NEGATIVE

## 2023-05-23 LAB — URINE CULTURE

## 2023-06-19 ENCOUNTER — Ambulatory Visit (INDEPENDENT_AMBULATORY_CARE_PROVIDER_SITE_OTHER): Payer: BC Managed Care – PPO

## 2023-06-19 ENCOUNTER — Other Ambulatory Visit (HOSPITAL_COMMUNITY)
Admission: RE | Admit: 2023-06-19 | Discharge: 2023-06-19 | Disposition: A | Discharge status: Home / Self Care | Payer: BC Managed Care – PPO | Source: Ambulatory Visit | Attending: Obstetrics & Gynecology | Admitting: Obstetrics & Gynecology

## 2023-06-19 DIAGNOSIS — B851 Pediculosis due to Pediculus humanus corporis: Secondary | ICD-10-CM | POA: Insufficient documentation

## 2023-06-19 DIAGNOSIS — N898 Other specified noninflammatory disorders of vagina: Secondary | ICD-10-CM | POA: Diagnosis not present

## 2023-06-19 DIAGNOSIS — Z87440 Personal history of urinary (tract) infections: Secondary | ICD-10-CM

## 2023-06-19 DIAGNOSIS — B9689 Other specified bacterial agents as the cause of diseases classified elsewhere: Secondary | ICD-10-CM

## 2023-06-19 NOTE — Progress Notes (Signed)
SUBJECTIVE:  27 y.o. female complains of clear vaginal discharge since UTI and never got better along with vaginal odor  dysuria and urinary urgency Denies abnormal vaginal bleeding or significant pelvic pain or fever.Denies history of known exposure to STD.  No LMP recorded.  OBJECTIVE:  She appears alert, well appearing, in no apparent distress Urine dipstick: not done.  ASSESSMENT:  Vaginal Discharge  Vaginal Odor Dysuria    PLAN:  GC, chlamydia, trichomonas, BVAG, CVAG probe and urine culture sent to lab. Treatment: To be determined once lab results are received ROV prn if symptoms persist or worsen.

## 2023-06-20 LAB — URINE CULTURE: Organism ID, Bacteria: NO GROWTH

## 2023-06-21 ENCOUNTER — Ambulatory Visit
Admission: EM | Admit: 2023-06-21 | Discharge: 2023-06-21 | Disposition: A | Discharge status: Home / Self Care | Payer: BC Managed Care – PPO | Attending: Emergency Medicine | Admitting: Emergency Medicine

## 2023-06-21 ENCOUNTER — Encounter: Payer: Self-pay | Admitting: Internal Medicine

## 2023-06-21 DIAGNOSIS — G479 Sleep disorder, unspecified: Secondary | ICD-10-CM | POA: Diagnosis not present

## 2023-06-21 DIAGNOSIS — B349 Viral infection, unspecified: Secondary | ICD-10-CM | POA: Diagnosis not present

## 2023-06-21 DIAGNOSIS — G47419 Narcolepsy without cataplexy: Secondary | ICD-10-CM | POA: Diagnosis not present

## 2023-06-21 DIAGNOSIS — R5383 Other fatigue: Secondary | ICD-10-CM | POA: Diagnosis not present

## 2023-06-21 LAB — CERVICOVAGINAL ANCILLARY ONLY
Bacterial Vaginitis (gardnerella): POSITIVE — AB
Candida Glabrata: NEGATIVE
Candida Vaginitis: NEGATIVE
Chlamydia: NEGATIVE
Comment: NEGATIVE
Comment: NEGATIVE
Comment: NEGATIVE
Comment: NEGATIVE
Comment: NEGATIVE
Comment: NORMAL
Neisseria Gonorrhea: NEGATIVE
Trichomonas: NEGATIVE

## 2023-06-21 LAB — POC COVID19/FLU A&B COMBO
Covid Antigen, POC: NEGATIVE
Influenza A Antigen, POC: NEGATIVE
Influenza B Antigen, POC: NEGATIVE

## 2023-06-21 LAB — POCT RAPID STREP A (OFFICE): Rapid Strep A Screen: NEGATIVE

## 2023-06-21 NOTE — ED Provider Notes (Signed)
Renaldo Fiddler    CSN: [id number] Arrival date & time: 06/21/23  1755      History   Chief Complaint Chief Complaint  Patient presents with   Sore Throat    HPI Heather Schmidt is a 27 y.o. female.  Patient presents with 1 day history of sore throat, runny nose, postnasal drip, congestion, sneezing.  Treating symptoms with Tylenol cold and flu.  She denies fever, cough, wheezing, shortness of breath.  She has history of asthma and has an albuterol inhaler for use if needed.  The history is provided by the patient and medical records.    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   Eczema 03/31/2020   Generalized anxiety disorder 12/05/2019   Abnormal cervical Papanicolaou smear 10/31/2019   Vitamin D insufficiency 10/31/2019   Nonintractable episodic headache 10/31/2019   Depression 10/31/2019   History of anaphylaxis 10/31/2019   Narcolepsy 10/08/2019    Past Surgical History:  Procedure Laterality Date   WISDOM TOOTH EXTRACTION      OB History     Gravida  0   Para  0   Term  0   Preterm  0   AB  0   Living  0      SAB  0   IAB  0   Ectopic  0   Multiple  0   Live Births  0            Home Medications    Prior to Admission medications   Medication Sig Start Date End Date Taking? Authorizing Provider  albuterol (VENTOLIN HFA) 108 (90 Base) MCG/ACT inhaler Inhale 2 puffs into the lungs every 6 (six) hours as needed for wheezing. 01/16/23  Yes [name], DO  Albuterol-Budesonide (AIRSUPRA) 90-80 MCG/ACT AERO  Inhale 2 puffs into the lungs every 6 (six) hours as needed. 05/11/23  Yes Mecum, Erin E, PA-C  Fluticasone Furoate-Vilanterol (BREO ELLIPTA) 50-25 MCG/ACT AEPB Inhale 1 Inhalation into the lungs daily in the afternoon. 05/11/23  Yes Mecum, Erin E, PA-C  modafinil (PROVIGIL) 100 MG tablet Take 100 mg by mouth 2 (two) times daily. 05/15/23  Yes [provider]  nitrofurantoin, macrocrystal-monohydrate, (MACROBID) 100 MG capsule Take 1 capsule (100 mg total) by mouth 2 (two) times daily. Patient not taking: Reported on 06/21/2023 05/18/23   Federico Flake, MD  nystatin (MYCOSTATIN) 100000 UNIT/ML suspension Take 5 mLs (500,000 Units total) by mouth 4 (four) times daily. Patient not taking: Reported on 06/21/2023 05/11/23   Mecum, Erin E, PA-C  ondansetron (ZOFRAN) 4 MG tablet Take 1 tablet (4 mg total) by mouth every 8 (eight) hours as needed for nausea or vomiting. Patient not taking: Reported on 06/21/2023 04/17/23   Waldon Merl, PA-C    Family History Family History  Problem Relation Age of Onset   Asthma Mother    Thyroid disease Mother    Asthma Father    Cancer Maternal Grandmother    COPD Maternal Grandmother    Hypertension Paternal Grandmother    Hypertension Paternal Grandfather  Social History Social History   Tobacco Use   Smoking status: Never   Smokeless tobacco: Never  Vaping Use   Vaping status: Never Used  Substance Use Topics   Alcohol use: Yes    Comment: rarely   Drug use: Not Currently    Types: Marijuana     Allergies   Fish allergy, Peanut-containing drug products, Shellfish allergy, Egg [egg-derived products], Other, and Latex   Review of Systems Review of Systems  Constitutional:  Negative for chills and fever.  HENT:  Positive for congestion, postnasal drip, rhinorrhea and sore throat. Negative for ear pain.   Respiratory:  Negative for cough, shortness of breath and wheezing.   Cardiovascular:  Negative for chest pain and  palpitations.  Gastrointestinal:  Negative for diarrhea and vomiting.     Physical Exam Triage Vital Signs ED Triage Vitals  Encounter Vitals Group     BP 06/21/23 1853 114/78     Systolic BP Percentile --      Diastolic BP Percentile --      Pulse Rate 06/21/23 1853 75     Resp 06/21/23 1853 18     Temp 06/21/23 1853 98.9 F (37.2 C)     Temp src --      SpO2 06/21/23 1853 97 %     Weight --      Height --      Head Circumference --      Peak Flow --      Pain Score 06/21/23 1903 7     Pain Loc --      Pain Education --      Exclude from Growth Chart --    No data found.  Updated Vital Signs BP 114/78   Pulse 75   Temp 98.9 F (37.2 C)   Resp 18   LMP 05/20/2023 (Approximate)   SpO2 97%   Visual Acuity Right Eye Distance:   Left Eye Distance:   Bilateral Distance:    Right Eye Near:   Left Eye Near:    Bilateral Near:     Physical Exam Constitutional:      General: She is not in acute distress. HENT:     Right Ear: Tympanic membrane normal.     Left Ear: Tympanic membrane normal.     Nose: Congestion and rhinorrhea present.     Mouth/Throat:     Mouth: Mucous membranes are moist.     Pharynx: Oropharynx is clear.  Cardiovascular:     Rate and Rhythm: Normal rate and regular rhythm.     Heart sounds: Normal heart sounds.  Pulmonary:     Effort: Pulmonary effort is normal. No respiratory distress.     Breath sounds: Normal breath sounds. No wheezing.  Skin:    General: Skin is warm and dry.  Neurological:     Mental Status: She is alert.      UC Treatments / Results  Labs (all labs ordered are listed, but only abnormal results are displayed) Labs Reviewed  POCT RAPID STREP A (OFFICE)  POC COVID19/FLU A&B COMBO    EKG   Radiology No results found.  Procedures Procedures (including critical care time)  Medications Ordered in UC Medications - No data to display  Initial Impression / Assessment and Plan / UC Course  I have  reviewed the triage vital signs and the nursing notes.  Pertinent labs & imaging results that were available during my care of the patient were reviewed by me and considered  in my medical decision making (see chart for details).    Viral illness.  Rapid strep negative.  Rapid COVID and flu negative.  Discussed symptomatic treatment including Tylenol or ibuprofen as needed for fever or discomfort, plain Mucinex as needed for congestion, rest, hydration.  Instructed patient to follow-up with PCP if not improving.  ED precautions given.  Patient agrees to plan of care.   Final Clinical Impressions(s) / UC Diagnoses   Final diagnoses:  Viral illness     Discharge Instructions      The strep, COVID and flu tests are negative.   Take Tylenol or ibuprofen as needed for fever or discomfort.  Take plain Mucinex as needed for congestion.  Rest and keep yourself hydrated.    Follow-up with your primary care provider if your symptoms are not improving.         ED Prescriptions   None    PDMP not reviewed this encounter.   Mickie Bail, NP 06/21/23 1946

## 2023-06-21 NOTE — Discharge Instructions (Addendum)
 The strep, COVID and flu tests are negative.   Take Tylenol or ibuprofen as needed for fever or discomfort.  Take plain Mucinex as needed for congestion.  Rest and keep yourself hydrated.    Follow-up with your primary care provider if your symptoms are not improving.

## 2023-06-21 NOTE — ED Triage Notes (Signed)
Sore throat, congestion, sneezing that started yesterday. Taking tylenol cold and flu.

## 2023-06-22 MED ORDER — METRONIDAZOLE 500 MG PO TABS: 500.0000 mg | ORAL_TABLET | Freq: Two times a day (BID) | ORAL | 0 refills | Status: AC

## 2023-06-22 NOTE — Addendum Note (Signed)
Addended by: Jaynie Collins A on: 06/22/2023 10:45 AM   Modules accepted: Orders

## 2023-07-07 DIAGNOSIS — J45909 Unspecified asthma, uncomplicated: Secondary | ICD-10-CM | POA: Diagnosis not present

## 2023-07-07 DIAGNOSIS — R531 Weakness: Secondary | ICD-10-CM | POA: Diagnosis not present

## 2023-07-07 DIAGNOSIS — M791 Myalgia, unspecified site: Secondary | ICD-10-CM | POA: Diagnosis not present

## 2023-07-07 DIAGNOSIS — Z20822 Contact with and (suspected) exposure to covid-19: Secondary | ICD-10-CM | POA: Diagnosis not present

## 2023-07-07 DIAGNOSIS — M79604 Pain in right leg: Secondary | ICD-10-CM | POA: Diagnosis not present

## 2023-07-07 DIAGNOSIS — M79605 Pain in left leg: Secondary | ICD-10-CM | POA: Diagnosis not present

## 2023-07-07 DIAGNOSIS — R29898 Other symptoms and signs involving the musculoskeletal system: Secondary | ICD-10-CM | POA: Diagnosis not present

## 2023-07-24 DIAGNOSIS — L710 Perioral dermatitis: Secondary | ICD-10-CM | POA: Diagnosis not present

## 2023-07-24 DIAGNOSIS — L308 Other specified dermatitis: Secondary | ICD-10-CM | POA: Diagnosis not present

## 2023-08-28 ENCOUNTER — Encounter: Payer: Self-pay | Admitting: Family Medicine

## 2023-09-06 ENCOUNTER — Encounter: Payer: Self-pay | Admitting: Pulmonary Disease

## 2023-09-25 ENCOUNTER — Other Ambulatory Visit: Payer: Self-pay | Admitting: *Deleted

## 2023-09-25 DIAGNOSIS — Z319 Encounter for procreative management, unspecified: Secondary | ICD-10-CM

## 2023-10-02 ENCOUNTER — Ambulatory Visit (HOSPITAL_COMMUNITY)
Admission: RE | Admit: 2023-10-02 | Discharge: 2023-10-02 | Disposition: A | Discharge status: Home / Self Care | Source: Ambulatory Visit | Attending: Family Medicine | Admitting: Family Medicine

## 2023-10-02 ENCOUNTER — Encounter: Payer: Self-pay | Admitting: Family Medicine

## 2023-10-02 DIAGNOSIS — N979 Female infertility, unspecified: Secondary | ICD-10-CM

## 2023-10-02 DIAGNOSIS — Z319 Encounter for procreative management, unspecified: Secondary | ICD-10-CM

## 2023-10-02 MED ORDER — IOHEXOL 300 MG/ML  SOLN
10.0000 mL | Freq: Once | INTRAMUSCULAR | Status: AC | PRN
Start: 2023-10-02 — End: 2023-10-02
  Administered 2023-10-02: 4 mL

## 2023-10-02 MED ORDER — DOXYCYCLINE HYCLATE 100 MG PO CAPS: 100.0000 mg | ORAL_CAPSULE | Freq: Two times a day (BID) | ORAL | 0 refills | Status: AC

## 2023-10-02 NOTE — Procedures (Signed)
 Patient given informed consent, signed copy in the chart, time out was performed. The patient was placed in the lithotomy position and the cervix brought into view with sterile speculum.  Portio of cervix cleansed x 2 with betadine swabs.  A tenaculum was placed in the anterior lip of the cervix. An os finder used to open cervix. An HSG catheter was introduced to into the uterus, balloon inflated. Four cc of dye injected into the uterus. Uterine cavity appeared normal. There was brisk filling of tubes bilaterally with good spill bilaterally. All equipment was removed and accounted for. The patient tolerated the procedure well. The results were shared with the patient in real time.

## 2023-11-19 ENCOUNTER — Ambulatory Visit: Admitting: Family Medicine

## 2023-12-08 ENCOUNTER — Ambulatory Visit
Admission: EM | Admit: 2023-12-08 | Discharge: 2023-12-08 | Disposition: A | Discharge status: Home / Self Care | Attending: Emergency Medicine | Admitting: Emergency Medicine

## 2023-12-08 DIAGNOSIS — S91012A Laceration without foreign body, left ankle, initial encounter: Secondary | ICD-10-CM

## 2023-12-08 MED ORDER — KETOROLAC TROMETHAMINE 30 MG/ML IJ SOLN
30.0000 mg | Freq: Once | INTRAMUSCULAR | Status: AC
  Administered 2023-12-08: 30 mg via INTRAMUSCULAR

## 2023-12-08 MED ORDER — PREDNISONE 10 MG (21) PO TBPK
ORAL_TABLET | Freq: Every day | ORAL | 0 refills | Status: AC
Start: 2023-12-08 — End: ?

## 2023-12-08 NOTE — Discharge Instructions (Signed)
 Patient evaluated for your left foot pain and the cuts your foot, these are superficial but there is swelling which is most likely exacerbating symptoms  Area does not appear to be infected and does not feel as if there is a foreign body still present  You have been given an injection of Toradol  to help reduce inflammation to help with pain and ideally would see some relief within the hour  Starting tomorrow take prednisone  to continue process above, may take Tylenol  additionally  Clean wounds daily with unscented soap and water, pat and do not rub, cover with a nonstick Band-Aid until healed  May follow-up with urgent care for any concerns regarding healing

## 2023-12-08 NOTE — ED Triage Notes (Signed)
 Patient presents to UC for left foot laceration to back of foot, stepped on glass at 1200. Has not taking any OTC meds. Last Tdap unknown.

## 2023-12-08 NOTE — ED Provider Notes (Signed)
 Heather Schmidt    CSN: [id number] Arrival date & time: 12/08/23  1113      History   Chief Complaint Chief Complaint  Patient presents with   Foot Pain    HPI Heather Schmidt is a 28 y.o. female.   Patient presents for evaluation of a laceration occurring to the left ankle 1 day ago.  Cut by a glass lamp which had fallen. Painful whenever bearing weight and unable to doing so, has been hopping.  Has not attempted treatment.  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   Eczema 03/31/2020   Generalized anxiety disorder 12/05/2019   Abnormal cervical Papanicolaou smear 10/31/2019   Vitamin D  insufficiency 10/31/2019   Nonintractable episodic headache 10/31/2019   Depression 10/31/2019   History of anaphylaxis 10/31/2019   Narcolepsy 10/08/2019    Past Surgical History:  Procedure Laterality Date   WISDOM TOOTH EXTRACTION      OB History     Gravida  0   Para  0   Term  0   Preterm  0   AB  0   Living  0      SAB  0   IAB  0   Ectopic  0   Multiple  0   Live Births  0            Home Medications    Prior to Admission medications   Medication Sig Start Date End Date Taking? Authorizing Provider  predniSONE  (STERAPRED UNI-PAK 21 TAB) 10 MG (21) TBPK tablet Take by mouth daily. Take 6 tabs by mouth daily  for 1 days, then 5 tabs for 1 days, then 4 tabs for 1 days, then 3 tabs for 1 days, 2 tabs for 1 days, then 1 tab by mouth daily for 1 days 12/08/23  Yes Tayra Dawe R, NP  albuterol  (VENTOLIN  HFA) 108 (90 Base) MCG/ACT  inhaler Inhale 2 puffs into the lungs every 6 (six) hours as needed for wheezing. 01/16/23   Rockney Cid, DO  Albuterol -Budesonide  (AIRSUPRA ) 90-80 MCG/ACT AERO Inhale 2 puffs into the lungs every 6 (six) hours as needed. 05/11/23   Mecum, Erin E, PA-C  Fluticasone Furoate-Vilanterol (BREO ELLIPTA ) 50-25 MCG/ACT AEPB Inhale 1 Inhalation into the lungs daily in the afternoon. 05/11/23   Mecum, Erin E, PA-C  modafinil  (PROVIGIL ) 100 MG tablet Take 100 mg by mouth 2 (two) times daily. 05/15/23   [provider]  nitrofurantoin , macrocrystal-monohydrate, (MACROBID ) 100 MG capsule Take 1 capsule (100 mg total) by mouth 2 (two) times daily. Patient not taking: Reported on 06/21/2023 05/18/23   Abner Ables, MD  nystatin  (MYCOSTATIN ) 100000 UNIT/ML suspension Take 5 mLs (500,000 Units total) by mouth 4 (four) times daily. Patient not taking: Reported on 06/21/2023 05/11/23   Mecum, Erin E, PA-C  ondansetron  (ZOFRAN ) 4 MG tablet Take 1 tablet (4 mg total) by mouth every 8 (eight) hours as needed for nausea or vomiting. Patient not taking: Reported on 06/21/2023 04/17/23   Farris Hong, PA-C    Family History Family History  Problem  Relation Age of Onset   Asthma Mother    Thyroid  disease Mother    Asthma Father    Cancer Maternal Grandmother    COPD Maternal Grandmother    Hypertension Paternal Grandmother    Hypertension Paternal Grandfather     Social History Social History   Tobacco Use   Smoking status: Never   Smokeless tobacco: Never  Vaping Use   Vaping status: Never Used  Substance Use Topics   Alcohol use: Yes    Comment: rarely   Drug use: Not Currently    Types: Marijuana     Allergies   Fish allergy, Peanut-containing drug products, Shellfish allergy, Egg [egg-derived products], Other, and Latex   Review of Systems Review of Systems   Physical Exam Triage Vital Signs ED Triage Vitals  Encounter Vitals Group     BP 12/08/23 1200 115/66      Systolic BP Percentile --      Diastolic BP Percentile --      Pulse Rate 12/08/23 1200 85     Resp 12/08/23 1200 16     Temp 12/08/23 1200 97.9 F (36.6 C)     Temp Source 12/08/23 1200 Temporal     SpO2 12/08/23 1200 97 %     Weight --      Height --      Head Circumference --      Peak Flow --      Pain Score 12/08/23 1159 8     Pain Loc --      Pain Education --      Exclude from Growth Chart --    No data found.  Updated Vital Signs BP 115/66 (BP Location: Left Arm)   Pulse 85   Temp 97.9 F (36.6 C) (Temporal)   Resp 16   LMP 12/06/2023   SpO2 97%   Visual Acuity Right Eye Distance:   Left Eye Distance:   Bilateral Distance:    Right Eye Near:   Left Eye Near:    Bilateral Near:     Physical Exam Constitutional:      Appearance: Normal appearance.  Eyes:     Extraocular Movements: Extraocular movements intact.  Pulmonary:     Effort: Pulmonary effort is normal.  Skin:    Comments: Less than 0.5 cm laceration present to the posterior left ankle, superficial, nondraining  2 cm superficial laceration present to the lateral aspect of the left heel, nondraining, moderate swelling, 2+ pedal pulse  Neurological:     Mental Status: She is alert and oriented to person, place, and time.      UC Treatments / Results  Labs (all labs ordered are listed, but only abnormal results are displayed) Labs Reviewed - No data to display  EKG   Radiology No results found.  Procedures Procedures (including critical care time)  Medications Ordered in UC Medications  ketorolac  (TORADOL ) 30 MG/ML injection 30 mg (30 mg Intramuscular Given by Other 12/08/23 1224)    Initial Impression / Assessment and Plan / UC Course  I have reviewed the triage vital signs and the nursing notes.  Pertinent labs & imaging results that were available during my care of the patient were reviewed by me and considered in my medical decision making (see chart for  details).  Laceration of left ankle, initial encounter  Lacerations are superficial and will not require approximation, discussed, no signs of infection, most likely painful due to to occurrence of injury, low suspicion of foreign body,  Toradol  IM given in clinic and prescribed prednisone  for home use recommended supportive care and advised daily cleansing, advised to monitor for signs of infection to return as needed Final Clinical Impressions(s) / UC Diagnoses   Final diagnoses:  Laceration of left ankle, initial encounter   Discharge Instructions      Patient evaluated for your left foot pain and the cuts your foot, these are superficial but there is swelling which is most likely exacerbating symptoms  Area does not appear to be infected and does not feel as if there is a foreign body still present  You have been given an injection of Toradol  to help reduce inflammation to help with pain and ideally would see some relief within the hour  Starting tomorrow take prednisone  to continue process above, may take Tylenol  additionally  Clean wounds daily with unscented soap and water, pat and do not rub, cover with a nonstick Band-Aid until healed  May follow-up with urgent care for any concerns regarding healing  ED Prescriptions     Medication Sig Dispense Auth. Provider   predniSONE  (STERAPRED UNI-PAK 21 TAB) 10 MG (21) TBPK tablet Take by mouth daily. Take 6 tabs by mouth daily  for 1 days, then 5 tabs for 1 days, then 4 tabs for 1 days, then 3 tabs for 1 days, 2 tabs for 1 days, then 1 tab by mouth daily for 1 days 21 tablet Darvell Monteforte, Maybelle Spatz, NP      PDMP not reviewed this encounter.   Reena Canning, NP 12/08/23 1332

## 2023-12-11 ENCOUNTER — Ambulatory Visit: Admitting: Family Medicine

## 2023-12-26 ENCOUNTER — Encounter: Payer: Self-pay | Admitting: Certified Nurse Midwife

## 2023-12-26 ENCOUNTER — Other Ambulatory Visit (HOSPITAL_COMMUNITY)
Admission: RE | Admit: 2023-12-26 | Discharge: 2023-12-26 | Disposition: A | Discharge status: Home / Self Care | Source: Ambulatory Visit | Attending: Certified Nurse Midwife | Admitting: Certified Nurse Midwife

## 2023-12-26 ENCOUNTER — Ambulatory Visit: Admitting: Certified Nurse Midwife

## 2023-12-26 VITALS — BP 119/75 | HR 68 | Wt 133.0 lb

## 2023-12-26 DIAGNOSIS — Z01419 Encounter for gynecological examination (general) (routine) without abnormal findings: Secondary | ICD-10-CM | POA: Insufficient documentation

## 2023-12-26 DIAGNOSIS — Z113 Encounter for screening for infections with a predominantly sexual mode of transmission: Secondary | ICD-10-CM | POA: Insufficient documentation

## 2023-12-26 DIAGNOSIS — Z1331 Encounter for screening for depression: Secondary | ICD-10-CM | POA: Diagnosis not present

## 2023-12-26 DIAGNOSIS — Z3009 Encounter for other general counseling and advice on contraception: Secondary | ICD-10-CM

## 2023-12-26 DIAGNOSIS — Z319 Encounter for procreative management, unspecified: Secondary | ICD-10-CM

## 2023-12-26 NOTE — Progress Notes (Unsigned)
 Patient presents for Annual.  LMP: 12/06/23 Last pap: 11/29/2022 LSIL  Contraception: None Mammogram: Not yet indicated STD Screening: Accepts   CC: Annual/None

## 2023-12-27 ENCOUNTER — Ambulatory Visit: Payer: Self-pay | Admitting: Certified Nurse Midwife

## 2023-12-27 LAB — RPR+HBSAG+HCVAB+...
HIV Screen 4th Generation wRfx: NONREACTIVE
Hep C Virus Ab: NONREACTIVE
Hepatitis B Surface Ag: NEGATIVE
RPR Ser Ql: NONREACTIVE

## 2023-12-27 NOTE — Progress Notes (Signed)
   GYNECOLOGY OFFICE VISIT NOTE  History:   Heather Schmidt is a 28 y.o. G0P0000 here today for annual well woman visit. She denies any abnormal vaginal discharge, bleeding, pelvic pain or other concerns. She does reports some lower abdominal discomfort during intercourse. Reports as cramping and soon as the act is over she no longer feels the pain. She request STD screening today.      Past Medical History:  Diagnosis Date   Asthma    Chlamydia 06/06/2019   COVID-19 06/23/2020   Depression    Narcolepsy     Past Surgical History:  Procedure Laterality Date   WISDOM TOOTH EXTRACTION      The following portions of the patient's history were reviewed and updated as appropriate: allergies, current medications, past family history, past medical history, past social history, past surgical history and problem list.   Health Maintenance:  Abnormal pap with LSIL and negative HRHPV on 11/29/22.   Review of Systems:  Pertinent items noted in HPI and remainder of comprehensive ROS otherwise negative.  Physical Exam:  BP 119/75   Pulse 68   Wt 133 lb (60.3 kg)   LMP 12/06/2023   BMI 24.33 kg/m  CONSTITUTIONAL: Well-developed, well-nourished female in no acute distress.  HEENT:  Normocephalic, atraumatic. External right and left ear normal. No scleral icterus.  NECK: Normal range of motion, supple, no masses noted on observation SKIN: No rash noted. Not diaphoretic. No erythema. No pallor. MUSCULOSKELETAL: Normal range of motion. No edema noted. NEUROLOGIC: Alert and oriented to person, place, and time. Normal muscle tone coordination. No cranial nerve deficit noted. PSYCHIATRIC: Normal mood and affect. Normal behavior. Normal judgment and thought content. CARDIOVASCULAR: Normal heart rate noted RESPIRATORY: Effort and breath sounds normal, no problems with respiration noted ABDOMEN: No masses noted. No other overt distention noted.   PELVIC: Normal appearing external genitalia;  normal urethral meatus; normal appearing vaginal mucosa and cervix.  No abnormal discharge noted.  Normal uterine size, no other palpable masses, no uterine or adnexal tenderness. Performed in the presence of a chaperone. Similar pain response with spec exam. But no over CVA tenderness.   Labs and Imaging Results for orders placed or performed in visit on 12/26/23 (from the past week)  RPR+HBsAg+HCVAb+...   Collection Time: 12/26/23  4:44 PM  Result Value Ref Range   Hepatitis B Surface Ag Negative Negative   Hep C Virus Ab Non Reactive Non Reactive   RPR Ser Ql Non Reactive Non Reactive   Interpretation: Comment    HIV Screen 4th Generation wRfx Non Reactive Non Reactive   No results found.    Assessment and Plan:    1. Encounter for well woman exam (Primary) - Patient overall doing well.  - Recommended to alternate positions during intercourse for more pleasurable experience. Denies symptoms of vaginismus.   2. Screening examination for STD (sexually transmitted disease) - STD screening completed at patient request  - Cytology - PAP - RPR+HBsAg+HCVAb+...   Routine preventative health maintenance measures emphasized. Please refer to After Visit Summary for other counseling recommendations.   Return in about 1 year (around 12/25/2024) for [place].    I spent 30 minutes dedicated to the care of this patient including pre-visit review of records, face to face time with the patient discussing her conditions and treatments and post visit orders.    Herschel Fleagle Erven) Emilio, MSN, CNM  [facility]  12/27/23 1:32 PM

## 2023-12-31 LAB — CYTOLOGY - PAP
Chlamydia: NEGATIVE
Comment: NEGATIVE
Comment: NEGATIVE
Comment: NEGATIVE
Comment: NORMAL
Diagnosis: NEGATIVE
High risk HPV: NEGATIVE
Neisseria Gonorrhea: NEGATIVE
Trichomonas: NEGATIVE

## 2024-04-08 ENCOUNTER — Other Ambulatory Visit: Payer: Self-pay

## 2024-04-08 ENCOUNTER — Ambulatory Visit: Payer: Self-pay | Admitting: Internal Medicine

## 2024-04-08 ENCOUNTER — Encounter: Payer: Self-pay | Admitting: Internal Medicine

## 2024-04-08 VITALS — BP 112/72 | HR 87 | Temp 98.2°F | Resp 16 | Ht 62.0 in | Wt 135.9 lb

## 2024-04-08 DIAGNOSIS — M6282 Rhabdomyolysis: Secondary | ICD-10-CM

## 2024-04-08 DIAGNOSIS — Z5971 Insufficient health insurance coverage: Secondary | ICD-10-CM

## 2024-04-08 DIAGNOSIS — Z09 Encounter for follow-up examination after completed treatment for conditions other than malignant neoplasm: Secondary | ICD-10-CM

## 2024-04-08 DIAGNOSIS — R7989 Other specified abnormal findings of blood chemistry: Secondary | ICD-10-CM

## 2024-04-08 LAB — COMPREHENSIVE METABOLIC PANEL WITH GFR
AG Ratio: 1.8 (calc) (ref 1.0–2.5)
ALT: 33 U/L — ABNORMAL HIGH (ref 6–29)
AST: 39 U/L — ABNORMAL HIGH (ref 10–30)
Albumin: 4.6 g/dL (ref 3.6–5.1)
Alkaline phosphatase (APISO): 80 U/L (ref 31–125)
BUN: 14 mg/dL (ref 7–25)
CO2: 25 mmol/L (ref 20–32)
Calcium: 9.3 mg/dL (ref 8.6–10.2)
Chloride: 105 mmol/L (ref 98–110)
Creat: 0.68 mg/dL (ref 0.50–0.96)
Globulin: 2.6 g/dL (ref 1.9–3.7)
Glucose, Bld: 90 mg/dL (ref 65–99)
Potassium: 4.2 mmol/L (ref 3.5–5.3)
Sodium: 140 mmol/L (ref 135–146)
Total Bilirubin: 0.8 mg/dL (ref 0.2–1.2)
Total Protein: 7.2 g/dL (ref 6.1–8.1)
eGFR: 123 mL/min/1.73m2 (ref >=60)

## 2024-04-08 NOTE — Progress Notes (Signed)
 Established Patient Office Visit  Subjective   Patient ID: Heather Schmidt, female    DOB: 1995-11-18  Age: 28 y.o. MRN: [id number]  Chief Complaint  Patient presents with   Hospitalization Follow-up    [facility]    HPI  Patient is here for hospital follow up.   Discussed the use of AI scribe software for clinical note transcription with the patient, who gave verbal consent to proceed.  History of Present Illness Heather Schmidt is a 28 year old female with a history of rhabdomyolysis who presents for follow-up after a recent hospitalization for the same condition.  She experienced muscle pain and soreness beginning approximately two weeks prior to her hospital admission. By the time of admission, her symptoms had improved, but she continued to experience significant muscle soreness. During her hospital stay, her creatine kinase (CK) levels were elevated, reaching 68,000 U/L at admission and decreasing to 13,000 U/L by discharge.  She currently has ongoing weakness, particularly in her hips and calves, and difficulty with tasks requiring strength, such as opening containers. Her urine was darker than usual, resembling 'golden' or 'apple juice' in color, which prompted her to seek medical attention. There is no swelling or significant changes in urination frequency, but she has some difficulty initiating urination, likely due to dehydration.  Her past medical history includes a previous episode of rhabdomyolysis associated with a strep infection and antibiotic use, during which she experienced dark, reddish-brown urine and muscle pain.  She is currently uninsured, having recently aged out of her parent's insurance plan, and is exploring options for Medicaid coverage. She was diagnosed with COVID-19 around the time her symptoms began, which may have contributed to her condition.    Discharge Date: 04/01/24 Hospital/facility: [facility] Diagnosis: non-traumatic rhabdomyolysis, elevated  LFT's Procedures/tests: initial CK 68,000, venous lactate normal, carnitine normal, acetylcarnitine panel all negative. COVID positive  Consultants: referral placed to Genetics, will call to schedule that appointment but is not currently insured right now New medications: None Discontinued medications: None Discharge instructions:  repeat CK, liver function tests Status: stable   Patient Active Problem List   Diagnosis Date Noted   Mild intermittent asthma with acute exacerbation 05/11/2023   Elevated LFTs 11/14/2022   Rhabdomyolysis 11/13/2022   Transaminitis 11/13/2022   Infertility management 04/19/2022   HSV (herpes simplex virus) infection 04/19/2022   Secondary oligomenorrhea 03/31/2020   Eczema 03/31/2020   Generalized anxiety disorder 12/05/2019   Abnormal cervical Papanicolaou smear 10/31/2019   Vitamin D  insufficiency 10/31/2019   Nonintractable episodic headache 10/31/2019   Depression 10/31/2019   History of anaphylaxis 10/31/2019   Narcolepsy 10/08/2019   Past Medical History:  Diagnosis Date   Asthma    Chlamydia 06/06/2019   COVID-19 06/23/2020   Depression    Narcolepsy    Past Surgical History:  Procedure Laterality Date   WISDOM TOOTH EXTRACTION     Social History   Tobacco Use   Smoking status: Never   Smokeless tobacco: Never  Vaping Use   Vaping status: Never Used  Substance Use Topics   Alcohol use: Yes    Comment: rarely   Drug use: Not Currently    Types: Marijuana   Social History   Socioeconomic History   Marital status: Media planner    Spouse name: Not on file   Number of children: Not on file   Years of education: Not on file   Highest education level: Some college, no degree  Occupational History   Not  on file  Tobacco Use   Smoking status: Never   Smokeless tobacco: Never  Vaping Use   Vaping status: Never Used  Substance and Sexual Activity   Alcohol use: Yes    Comment: rarely   Drug use: Not Currently     Types: Marijuana   Sexual activity: Yes    Birth control/protection: None  Other Topics Concern   Not on file  Social History Narrative   Not on file   Social Drivers of Health   Financial Resource Strain: Not on file  Food Insecurity: No Food Insecurity (11/14/2022)   Hunger Vital Sign    Worried About Running Out of Food in the Last Year: Never true    Ran Out of Food in the Last Year: Never true  Transportation Needs: No Transportation Needs (11/14/2022)   PRAPARE - Administrator, Civil Service (Medical): No    Lack of Transportation (Non-Medical): No  Physical Activity: Not on file  Stress: Not on file  Social Connections: Not on file  Intimate Partner Violence: Not At Risk (11/14/2022)   Humiliation, Afraid, Rape, and Kick questionnaire    Fear of Current or Ex-Partner: No    Emotionally Abused: No    Physically Abused: No    Sexually Abused: No   Family Status  Relation Name Status   Mother  Alive   Father  Alive   Sister  Alive   Brother  Alive   MGM  Alive   MGF  Deceased   PGM  Alive   PGF  Alive   Sister  Alive  No partnership data on file   Family History  Problem Relation Age of Onset   Asthma Mother    Thyroid  disease Mother    Asthma Father    Cancer Maternal Grandmother    COPD Maternal Grandmother    Hypertension Paternal Grandmother    Hypertension Paternal Grandfather    Allergies  Allergen Reactions   Fish Allergy Anaphylaxis   Peanut-Containing Drug Products Anaphylaxis   Shellfish Allergy Anaphylaxis   Egg [Egg-Derived Products] Nausea And Vomiting   Other Cough    Ginger and barley   Latex Rash      Review of Systems  All other systems reviewed and are negative.     Objective:     BP 112/72 (Cuff Size: Normal)   Pulse 87   Temp 98.2 F (36.8 C) (Oral)   Resp 16   Ht 5' 2 (1.575 m)   Wt 135 lb 14.4 oz (61.6 kg)   LMP 03/19/2024   SpO2 95%   BMI 24.86 kg/m  BP Readings from Last 3 Encounters:  04/08/24  112/72  12/26/23 119/75  12/08/23 115/66   Wt Readings from Last 3 Encounters:  04/08/24 135 lb 14.4 oz (61.6 kg)  12/26/23 133 lb (60.3 kg)  05/11/23 129 lb (58.5 kg)      Physical Exam Constitutional:      Appearance: Normal appearance.  HENT:     Head: Normocephalic and atraumatic.  Eyes:     Conjunctiva/sclera: Conjunctivae normal.  Cardiovascular:     Rate and Rhythm: Normal rate and regular rhythm.  Pulmonary:     Effort: Pulmonary effort is normal.     Breath sounds: Normal breath sounds.  Skin:    General: Skin is warm and dry.  Neurological:     General: No focal deficit present.     Mental Status: She is alert. Mental status is at baseline.  Psychiatric:        Mood and Affect: Mood normal.        Behavior: Behavior normal.      No results found for any visits on 04/08/24.  Last CBC Lab Results  Component Value Date   WBC 12.7 (H) 12/06/2022   HGB 11.6 (L) 12/06/2022   HCT 35.5 (L) 12/06/2022   MCV 88.1 12/06/2022   MCH 28.8 12/06/2022   RDW 13.1 12/06/2022   PLT 232 12/06/2022   Last metabolic panel Lab Results  Component Value Date   GLUCOSE 87 12/04/2022   NA 137 12/04/2022   K 3.6 12/04/2022   CL 106 12/04/2022   CO2 23 12/04/2022   BUN 11 12/04/2022   CREATININE 0.72 12/04/2022   GFRNONAA >60 12/04/2022   CALCIUM 9.0 12/04/2022   PHOS 2.9 11/14/2022   PROT 7.6 12/04/2022   ALBUMIN 4.4 12/04/2022   LABGLOB 3.0 11/14/2022   AGRATIO 1.1 11/14/2022   BILITOT 1.7 (H) 12/04/2022   ALKPHOS 77 12/04/2022   AST 29 12/04/2022   ALT 33 12/04/2022   ANIONGAP 8 12/04/2022   Last lipids No results found for: CHOL, HDL, LDLCALC, LDLDIRECT, TRIG, CHOLHDL Last hemoglobin A1c No results found for: HGBA1C Last thyroid  functions Lab Results  Component Value Date   TSH 1.410 11/13/2022   Last vitamin D  Lab Results  Component Value Date   VD25OH 23.6 (L) 10/08/2019   Last vitamin B12 and Folate No results found for:  VITAMINB12, FOLATE    The ASCVD Risk score (Arnett DK, et al., 2019) failed to calculate for the following reasons:   The 2019 ASCVD risk score is only valid for ages 33 to 25    Assessment & Plan:   Assessment & Plan Rhabdomyolysis with abnormal blood chemistry findings 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 Elevated CK and liver enzymes, no kidney damage but monitoring required. Genetic predisposition considered, pending geneticist referral. - Recheck CK, liver, and kidney function. - Ensure adequate hydration. - Advise against strenuous activity; recommend walking. - Increase dietary protein intake. - Facilitate social worker referral for Medicaid assistance. - Encourage geneticist follow-up post insurance resolution.  - Comprehensive Metabolic Panel (CMET) - CK; Future - AMB Referral VBCI Care Management   Return in about 6 months (around 10/07/2024).    Sharyle Fischer, DO

## 2024-04-09 ENCOUNTER — Ambulatory Visit: Payer: Self-pay | Admitting: Internal Medicine

## 2024-04-09 ENCOUNTER — Telehealth: Payer: Self-pay

## 2024-04-09 NOTE — Progress Notes (Signed)
 Complex Care Management Note Care Guide Note  04/09/2024 Name: Heather Schmidt MRN: [id number] DOB: 05-10-1996   Complex Care Management Outreach Attempts: An unsuccessful telephone outreach was attempted today to offer the patient information about available complex care management services.  Follow Up Plan:  Additional outreach attempts will be made to offer the patient complex care management information and services.   Encounter Outcome:  No Answer  Heather [facility]  [facility], [facility] VBCI Assistant Direct Dial: [phone]  Fax: [phone]

## 2024-04-14 NOTE — Progress Notes (Signed)
 Complex Care Management Note  Care Guide Note 04/14/2024 Name: Raelee Rossmann MRN: [id number] DOB: October 12, 1995  Kandi Jamse Kassabian is a 28 y.o. year old female who sees Bernardo Fend, DO for primary care. I reached out to [employer] by phone today to offer complex care management services.  Ms. Demauro was given information about Complex Care Management services today including:   The Complex Care Management services include support from the care team which includes your Nurse Care Manager, Clinical Social Worker, or Pharmacist.  The Complex Care Management team is here to help remove barriers to the health concerns and goals most important to you. Complex Care Management services are voluntary, and the patient may decline or stop services at any time by request to their care team member.   Complex Care Management Consent Status: Patient agreed to services and verbal consent obtained.   Follow up plan:  Telephone appointment with complex care management team member scheduled for:  05/01/24 at 3:00 p.m.   Encounter Outcome:  Patient Scheduled  Dreama Lynwood [facility]  [facility], [facility] VBCI Assistant Direct Dial: [phone]  Fax: [phone]

## 2024-05-01 ENCOUNTER — Other Ambulatory Visit: Payer: Self-pay | Admitting: *Deleted

## 2024-05-01 NOTE — Patient Outreach (Signed)
 Patient referred to this social worker to assist with applying for Medicaid . Patient declined having any additional community resource needs at this time. Patient aware of website to apply for Medicaid [email or web address] stating that she has used this platform in the past but did not finish the process. CSW encouraged patient to complete the application process using this platform as this is the best mode of contact with the Department of Social Services to complete this process.  Patient also encouraged to call this social worker with any questions or concerns.     Tania Perrott, LCSW [place]  [facility], [facility] Licensed Clinical Social Worker  Direct Dial: [phone]

## 2024-06-23 ENCOUNTER — Encounter: Payer: Self-pay | Admitting: Internal Medicine

## 2024-07-02 ENCOUNTER — Other Ambulatory Visit: Payer: Self-pay | Admitting: Internal Medicine

## 2024-07-02 LAB — CK: Total CK: 76 U/L (ref 20–239)

## 2024-07-04 ENCOUNTER — Ambulatory Visit: Payer: Self-pay | Admitting: Internal Medicine

## 2024-10-07 ENCOUNTER — Ambulatory Visit: Admitting: Internal Medicine
